# Patient Record
Sex: Female | Born: 1945 | Race: Black or African American | Hispanic: No | Smoking: Former smoker
Health system: Southern US, Community
[De-identification: ages and names within clinical notes are randomized; demographics above are authoritative.]

## PROBLEM LIST (undated history)

## (undated) DIAGNOSIS — I739 Peripheral vascular disease, unspecified: Secondary | ICD-10-CM

## (undated) DIAGNOSIS — Z9221 Personal history of antineoplastic chemotherapy: Secondary | ICD-10-CM

## (undated) DIAGNOSIS — K209 Esophagitis, unspecified without bleeding: Secondary | ICD-10-CM

## (undated) DIAGNOSIS — K219 Gastro-esophageal reflux disease without esophagitis: Secondary | ICD-10-CM

## (undated) DIAGNOSIS — E785 Hyperlipidemia, unspecified: Secondary | ICD-10-CM

## (undated) DIAGNOSIS — N189 Chronic kidney disease, unspecified: Secondary | ICD-10-CM

## (undated) DIAGNOSIS — M810 Age-related osteoporosis without current pathological fracture: Secondary | ICD-10-CM

## (undated) DIAGNOSIS — I340 Nonrheumatic mitral (valve) insufficiency: Secondary | ICD-10-CM

## (undated) DIAGNOSIS — I517 Cardiomegaly: Secondary | ICD-10-CM

## (undated) DIAGNOSIS — Z9889 Other specified postprocedural states: Secondary | ICD-10-CM

## (undated) DIAGNOSIS — K635 Polyp of colon: Secondary | ICD-10-CM

## (undated) DIAGNOSIS — C541 Malignant neoplasm of endometrium: Secondary | ICD-10-CM

## (undated) DIAGNOSIS — I071 Rheumatic tricuspid insufficiency: Secondary | ICD-10-CM

## (undated) DIAGNOSIS — Z9289 Personal history of other medical treatment: Secondary | ICD-10-CM

## (undated) DIAGNOSIS — J45909 Unspecified asthma, uncomplicated: Secondary | ICD-10-CM

## (undated) DIAGNOSIS — I2510 Atherosclerotic heart disease of native coronary artery without angina pectoris: Secondary | ICD-10-CM

## (undated) DIAGNOSIS — K2970 Gastritis, unspecified, without bleeding: Secondary | ICD-10-CM

## (undated) DIAGNOSIS — D649 Anemia, unspecified: Secondary | ICD-10-CM

## (undated) DIAGNOSIS — Z955 Presence of coronary angioplasty implant and graft: Secondary | ICD-10-CM

## (undated) DIAGNOSIS — I639 Cerebral infarction, unspecified: Secondary | ICD-10-CM

## (undated) DIAGNOSIS — Z923 Personal history of irradiation: Secondary | ICD-10-CM

## (undated) DIAGNOSIS — F329 Major depressive disorder, single episode, unspecified: Secondary | ICD-10-CM

## (undated) DIAGNOSIS — I82621 Acute embolism and thrombosis of deep veins of right upper extremity: Secondary | ICD-10-CM

## (undated) DIAGNOSIS — F32A Depression, unspecified: Secondary | ICD-10-CM

## (undated) DIAGNOSIS — I10 Essential (primary) hypertension: Secondary | ICD-10-CM

## (undated) DIAGNOSIS — I6529 Occlusion and stenosis of unspecified carotid artery: Secondary | ICD-10-CM

## (undated) DIAGNOSIS — E119 Type 2 diabetes mellitus without complications: Secondary | ICD-10-CM

## (undated) HISTORY — DX: Personal history of antineoplastic chemotherapy: Z92.21

## (undated) HISTORY — PX: ABDOMINAL HYSTERECTOMY: SHX81

## (undated) HISTORY — DX: Presence of coronary angioplasty implant and graft: Z95.5

## (undated) HISTORY — DX: Hyperlipidemia, unspecified: E78.5

## (undated) HISTORY — DX: Atherosclerotic heart disease of native coronary artery without angina pectoris: I25.10

## (undated) HISTORY — DX: Depression, unspecified: F32.A

## (undated) HISTORY — DX: Anemia, unspecified: D64.9

## (undated) HISTORY — DX: Occlusion and stenosis of unspecified carotid artery: I65.29

## (undated) HISTORY — DX: Age-related osteoporosis without current pathological fracture: M81.0

## (undated) HISTORY — DX: Other specified postprocedural states: Z98.890

## (undated) HISTORY — DX: Esophagitis, unspecified without bleeding: K20.90

## (undated) HISTORY — DX: Acute embolism and thrombosis of deep veins of right upper extremity: I82.621

## (undated) HISTORY — DX: Peripheral vascular disease, unspecified: I73.9

## (undated) HISTORY — DX: Gastritis, unspecified, without bleeding: K29.70

## (undated) HISTORY — DX: Cardiomegaly: I51.7

## (undated) HISTORY — PX: CORONARY ARTERY BYPASS GRAFT: SHX141

## (undated) HISTORY — DX: Rheumatic tricuspid insufficiency: I07.1

## (undated) HISTORY — DX: Esophagitis, unspecified: K20.9

## (undated) HISTORY — DX: Malignant neoplasm of endometrium: C54.1

## (undated) HISTORY — DX: Nonrheumatic mitral (valve) insufficiency: I34.0

## (undated) HISTORY — DX: Polyp of colon: K63.5

## (undated) HISTORY — DX: Personal history of irradiation: Z92.3

## (undated) HISTORY — DX: Personal history of other medical treatment: Z92.89

## (undated) HISTORY — DX: Major depressive disorder, single episode, unspecified: F32.9

## (undated) HISTORY — DX: Gastro-esophageal reflux disease without esophagitis: K21.9

---

## 2002-11-16 DIAGNOSIS — Z955 Presence of coronary angioplasty implant and graft: Secondary | ICD-10-CM

## 2002-11-16 HISTORY — PX: FEMORAL ARTERY - POPLITEAL ARTERY BYPASS GRAFT: SUR180

## 2002-11-16 HISTORY — DX: Presence of coronary angioplasty implant and graft: Z95.5

## 2002-11-16 HISTORY — PX: FEMORAL ARTERY - FEMORAL ARTERY BYPASS GRAFT: SUR179

## 2003-11-17 DIAGNOSIS — I82621 Acute embolism and thrombosis of deep veins of right upper extremity: Secondary | ICD-10-CM

## 2003-11-17 HISTORY — DX: Acute embolism and thrombosis of deep veins of right upper extremity: I82.621

## 2003-12-13 ENCOUNTER — Other Ambulatory Visit: Payer: Self-pay

## 2003-12-19 ENCOUNTER — Other Ambulatory Visit: Payer: Self-pay

## 2003-12-20 ENCOUNTER — Other Ambulatory Visit: Payer: Self-pay

## 2004-07-04 ENCOUNTER — Other Ambulatory Visit: Payer: Self-pay

## 2005-02-26 ENCOUNTER — Inpatient Hospital Stay: Payer: Self-pay | Admitting: Internal Medicine

## 2005-05-04 ENCOUNTER — Ambulatory Visit: Payer: Self-pay | Admitting: Unknown Physician Specialty

## 2005-05-06 ENCOUNTER — Emergency Department: Payer: Self-pay | Admitting: Emergency Medicine

## 2005-05-06 ENCOUNTER — Other Ambulatory Visit: Payer: Self-pay

## 2005-09-08 ENCOUNTER — Ambulatory Visit: Payer: Self-pay | Admitting: Unknown Physician Specialty

## 2006-03-29 ENCOUNTER — Ambulatory Visit: Payer: Self-pay | Admitting: Unknown Physician Specialty

## 2006-12-12 ENCOUNTER — Other Ambulatory Visit: Payer: Self-pay

## 2006-12-12 ENCOUNTER — Inpatient Hospital Stay: Payer: Self-pay | Admitting: Internal Medicine

## 2006-12-13 ENCOUNTER — Other Ambulatory Visit: Payer: Self-pay

## 2006-12-15 ENCOUNTER — Other Ambulatory Visit: Payer: Self-pay

## 2006-12-16 ENCOUNTER — Other Ambulatory Visit: Payer: Self-pay

## 2007-04-15 ENCOUNTER — Other Ambulatory Visit: Payer: Self-pay

## 2007-04-15 ENCOUNTER — Inpatient Hospital Stay: Payer: Self-pay | Admitting: Internal Medicine

## 2007-04-29 ENCOUNTER — Ambulatory Visit: Payer: Self-pay | Admitting: Unknown Physician Specialty

## 2007-05-11 ENCOUNTER — Ambulatory Visit: Payer: Self-pay | Admitting: Unknown Physician Specialty

## 2007-07-21 ENCOUNTER — Ambulatory Visit: Payer: Self-pay | Admitting: Obstetrics and Gynecology

## 2007-07-26 ENCOUNTER — Ambulatory Visit: Payer: Self-pay | Admitting: Gynecologic Oncology

## 2007-08-05 ENCOUNTER — Other Ambulatory Visit: Payer: Self-pay

## 2007-08-05 ENCOUNTER — Ambulatory Visit: Payer: Self-pay | Admitting: Obstetrics and Gynecology

## 2007-08-09 ENCOUNTER — Inpatient Hospital Stay: Payer: Self-pay | Admitting: Obstetrics and Gynecology

## 2007-08-17 ENCOUNTER — Ambulatory Visit: Payer: Self-pay | Admitting: Internal Medicine

## 2007-08-23 ENCOUNTER — Ambulatory Visit: Payer: Self-pay | Admitting: Internal Medicine

## 2007-09-17 ENCOUNTER — Ambulatory Visit: Payer: Self-pay | Admitting: Internal Medicine

## 2007-09-17 DIAGNOSIS — C541 Malignant neoplasm of endometrium: Secondary | ICD-10-CM

## 2007-09-17 HISTORY — DX: Malignant neoplasm of endometrium: C54.1

## 2007-09-17 HISTORY — PX: TOTAL ABDOMINAL HYSTERECTOMY W/ BILATERAL SALPINGOOPHORECTOMY: SHX83

## 2007-10-04 ENCOUNTER — Ambulatory Visit: Payer: Self-pay | Admitting: Gynecologic Oncology

## 2007-10-11 ENCOUNTER — Ambulatory Visit: Payer: Self-pay | Admitting: Vascular Surgery

## 2007-10-17 ENCOUNTER — Ambulatory Visit: Payer: Self-pay | Admitting: Internal Medicine

## 2007-10-17 ENCOUNTER — Ambulatory Visit: Payer: Self-pay | Admitting: Gynecologic Oncology

## 2007-11-17 ENCOUNTER — Ambulatory Visit: Payer: Self-pay | Admitting: Gynecologic Oncology

## 2007-11-17 ENCOUNTER — Ambulatory Visit: Payer: Self-pay | Admitting: Internal Medicine

## 2007-12-18 ENCOUNTER — Ambulatory Visit: Payer: Self-pay | Admitting: Gynecologic Oncology

## 2007-12-18 ENCOUNTER — Ambulatory Visit: Payer: Self-pay | Admitting: Internal Medicine

## 2008-01-15 ENCOUNTER — Ambulatory Visit: Payer: Self-pay | Admitting: Gynecologic Oncology

## 2008-01-15 ENCOUNTER — Ambulatory Visit: Payer: Self-pay | Admitting: Internal Medicine

## 2008-01-15 DIAGNOSIS — Z923 Personal history of irradiation: Secondary | ICD-10-CM

## 2008-01-15 HISTORY — DX: Personal history of irradiation: Z92.3

## 2008-02-15 ENCOUNTER — Ambulatory Visit: Payer: Self-pay | Admitting: Gynecologic Oncology

## 2008-02-15 ENCOUNTER — Ambulatory Visit: Payer: Self-pay | Admitting: Internal Medicine

## 2008-03-16 ENCOUNTER — Ambulatory Visit: Payer: Self-pay | Admitting: Internal Medicine

## 2008-03-16 ENCOUNTER — Ambulatory Visit: Payer: Self-pay | Admitting: Gynecologic Oncology

## 2008-04-16 ENCOUNTER — Ambulatory Visit: Payer: Self-pay | Admitting: Internal Medicine

## 2008-04-16 ENCOUNTER — Ambulatory Visit: Payer: Self-pay | Admitting: Gynecologic Oncology

## 2008-05-16 ENCOUNTER — Ambulatory Visit: Payer: Self-pay | Admitting: Gynecologic Oncology

## 2008-05-16 ENCOUNTER — Ambulatory Visit: Payer: Self-pay | Admitting: Internal Medicine

## 2008-06-16 ENCOUNTER — Ambulatory Visit: Payer: Self-pay | Admitting: Gynecologic Oncology

## 2008-07-05 ENCOUNTER — Ambulatory Visit: Payer: Self-pay | Admitting: Internal Medicine

## 2008-07-17 ENCOUNTER — Ambulatory Visit: Payer: Self-pay | Admitting: Radiation Oncology

## 2008-07-27 ENCOUNTER — Ambulatory Visit: Payer: Self-pay | Admitting: Internal Medicine

## 2008-08-16 ENCOUNTER — Ambulatory Visit: Payer: Self-pay | Admitting: Internal Medicine

## 2008-08-16 ENCOUNTER — Ambulatory Visit: Payer: Self-pay | Admitting: Radiation Oncology

## 2008-09-16 ENCOUNTER — Ambulatory Visit: Payer: Self-pay | Admitting: Radiation Oncology

## 2008-09-16 ENCOUNTER — Ambulatory Visit: Payer: Self-pay | Admitting: Internal Medicine

## 2008-09-26 ENCOUNTER — Ambulatory Visit: Payer: Self-pay | Admitting: Unknown Physician Specialty

## 2008-10-23 ENCOUNTER — Ambulatory Visit: Payer: Self-pay | Admitting: Vascular Surgery

## 2008-10-26 ENCOUNTER — Ambulatory Visit: Payer: Self-pay | Admitting: Internal Medicine

## 2008-11-16 ENCOUNTER — Ambulatory Visit: Payer: Self-pay | Admitting: Internal Medicine

## 2008-11-16 ENCOUNTER — Ambulatory Visit: Payer: Self-pay | Admitting: Gynecologic Oncology

## 2008-12-17 ENCOUNTER — Ambulatory Visit: Payer: Self-pay | Admitting: Internal Medicine

## 2009-01-15 ENCOUNTER — Ambulatory Visit: Payer: Self-pay | Admitting: Internal Medicine

## 2009-02-14 ENCOUNTER — Ambulatory Visit: Payer: Self-pay | Admitting: Internal Medicine

## 2009-03-12 ENCOUNTER — Ambulatory Visit: Payer: Self-pay | Admitting: Internal Medicine

## 2009-03-15 ENCOUNTER — Ambulatory Visit: Payer: Self-pay | Admitting: Vascular Surgery

## 2009-03-16 ENCOUNTER — Ambulatory Visit: Payer: Self-pay | Admitting: Internal Medicine

## 2009-04-16 ENCOUNTER — Ambulatory Visit: Payer: Self-pay | Admitting: Internal Medicine

## 2009-05-21 ENCOUNTER — Ambulatory Visit: Payer: Self-pay | Admitting: Internal Medicine

## 2009-05-21 ENCOUNTER — Ambulatory Visit: Payer: Self-pay | Admitting: Gynecologic Oncology

## 2009-06-16 ENCOUNTER — Ambulatory Visit: Payer: Self-pay | Admitting: Gynecologic Oncology

## 2009-06-16 ENCOUNTER — Ambulatory Visit: Payer: Self-pay | Admitting: Internal Medicine

## 2009-07-17 ENCOUNTER — Ambulatory Visit: Payer: Self-pay | Admitting: Internal Medicine

## 2009-07-24 ENCOUNTER — Ambulatory Visit: Payer: Self-pay | Admitting: Internal Medicine

## 2009-08-10 ENCOUNTER — Emergency Department: Payer: Self-pay | Admitting: Unknown Physician Specialty

## 2009-08-14 ENCOUNTER — Ambulatory Visit: Payer: Self-pay | Admitting: Internal Medicine

## 2009-08-16 ENCOUNTER — Ambulatory Visit: Payer: Self-pay | Admitting: Internal Medicine

## 2009-08-20 ENCOUNTER — Ambulatory Visit: Payer: Self-pay | Admitting: Internal Medicine

## 2009-09-16 ENCOUNTER — Ambulatory Visit: Payer: Self-pay | Admitting: Internal Medicine

## 2009-10-15 ENCOUNTER — Ambulatory Visit: Payer: Self-pay | Admitting: Internal Medicine

## 2009-10-16 ENCOUNTER — Ambulatory Visit: Payer: Self-pay | Admitting: Internal Medicine

## 2010-01-14 ENCOUNTER — Ambulatory Visit: Payer: Self-pay | Admitting: Internal Medicine

## 2010-01-29 ENCOUNTER — Ambulatory Visit: Payer: Self-pay | Admitting: Internal Medicine

## 2010-02-14 ENCOUNTER — Ambulatory Visit: Payer: Self-pay | Admitting: Internal Medicine

## 2010-02-19 ENCOUNTER — Ambulatory Visit: Payer: Self-pay | Admitting: Internal Medicine

## 2010-04-02 ENCOUNTER — Ambulatory Visit: Payer: Self-pay | Admitting: Internal Medicine

## 2010-04-07 ENCOUNTER — Ambulatory Visit: Payer: Self-pay | Admitting: Internal Medicine

## 2010-04-16 ENCOUNTER — Ambulatory Visit: Payer: Self-pay | Admitting: Internal Medicine

## 2010-04-29 ENCOUNTER — Ambulatory Visit: Payer: Self-pay | Admitting: Internal Medicine

## 2010-05-16 ENCOUNTER — Ambulatory Visit: Payer: Self-pay | Admitting: Internal Medicine

## 2010-08-16 ENCOUNTER — Ambulatory Visit: Payer: Self-pay | Admitting: Internal Medicine

## 2010-08-22 ENCOUNTER — Ambulatory Visit: Payer: Self-pay | Admitting: Internal Medicine

## 2010-08-24 LAB — CA 125: CA 125: 5.7 U/mL (ref 0.0–34.0)

## 2010-09-04 ENCOUNTER — Ambulatory Visit: Payer: Self-pay | Admitting: Internal Medicine

## 2010-09-16 ENCOUNTER — Ambulatory Visit: Payer: Self-pay | Admitting: Internal Medicine

## 2010-11-25 ENCOUNTER — Ambulatory Visit: Payer: Self-pay | Admitting: Internal Medicine

## 2010-11-27 LAB — CA 125: CA 125: 5.7 U/mL (ref 0.0–34.0)

## 2010-12-17 ENCOUNTER — Ambulatory Visit: Payer: Self-pay | Admitting: Internal Medicine

## 2011-03-05 ENCOUNTER — Ambulatory Visit: Payer: Self-pay | Admitting: Internal Medicine

## 2011-03-06 LAB — CA 125: CA 125: 7.3 U/mL (ref 0.0–34.0)

## 2011-03-17 ENCOUNTER — Ambulatory Visit: Payer: Self-pay | Admitting: Internal Medicine

## 2011-04-17 ENCOUNTER — Ambulatory Visit: Payer: Self-pay | Admitting: Internal Medicine

## 2011-05-26 ENCOUNTER — Ambulatory Visit: Payer: Self-pay | Admitting: Internal Medicine

## 2011-05-27 LAB — CA 125: CA 125: 8 U/mL (ref 0.0–34.0)

## 2011-06-17 ENCOUNTER — Ambulatory Visit: Payer: Self-pay | Admitting: Internal Medicine

## 2011-07-02 ENCOUNTER — Ambulatory Visit: Payer: Self-pay | Admitting: Internal Medicine

## 2011-07-17 ENCOUNTER — Ambulatory Visit: Payer: Self-pay | Admitting: Unknown Physician Specialty

## 2011-07-18 ENCOUNTER — Ambulatory Visit: Payer: Self-pay | Admitting: Internal Medicine

## 2011-08-25 ENCOUNTER — Ambulatory Visit: Payer: Self-pay | Admitting: Unknown Physician Specialty

## 2011-08-27 LAB — PATHOLOGY REPORT

## 2011-09-04 ENCOUNTER — Other Ambulatory Visit: Payer: Self-pay | Admitting: Unknown Physician Specialty

## 2011-09-04 ENCOUNTER — Other Ambulatory Visit: Payer: Self-pay | Admitting: Gastroenterology

## 2011-09-04 DIAGNOSIS — K56609 Unspecified intestinal obstruction, unspecified as to partial versus complete obstruction: Secondary | ICD-10-CM

## 2011-09-25 ENCOUNTER — Ambulatory Visit
Admission: RE | Admit: 2011-09-25 | Discharge: 2011-09-25 | Disposition: A | Discharge status: Home / Self Care | Payer: Medicare Other | Source: Ambulatory Visit | Attending: Gastroenterology | Admitting: Gastroenterology

## 2011-09-25 ENCOUNTER — Other Ambulatory Visit: Payer: Self-pay | Admitting: Unknown Physician Specialty

## 2011-09-25 DIAGNOSIS — K56609 Unspecified intestinal obstruction, unspecified as to partial versus complete obstruction: Secondary | ICD-10-CM

## 2011-10-05 ENCOUNTER — Ambulatory Visit: Payer: Self-pay | Admitting: Internal Medicine

## 2011-11-24 ENCOUNTER — Ambulatory Visit: Payer: Self-pay | Admitting: Internal Medicine

## 2011-11-25 LAB — CA 125: CA 125: 8.1 U/mL (ref 0.0–34.0)

## 2011-12-18 ENCOUNTER — Ambulatory Visit: Payer: Self-pay | Admitting: Internal Medicine

## 2012-03-04 ENCOUNTER — Ambulatory Visit: Payer: Self-pay | Admitting: Oncology

## 2012-03-04 LAB — CBC CANCER CENTER
Basophil #: 0.1 x10 3/mm (ref 0.0–0.1)
Basophil %: 1 %
Eosinophil #: 0.2 x10 3/mm (ref 0.0–0.7)
Eosinophil %: 4.1 %
HCT: 37.7 % (ref 35.0–47.0)
HGB: 12.3 g/dL (ref 12.0–16.0)
Lymphocyte #: 2.1 x10 3/mm (ref 1.0–3.6)
Lymphocyte %: 38.6 %
MCH: 27.6 pg (ref 26.0–34.0)
MCHC: 32.7 g/dL (ref 32.0–36.0)
MCV: 85 fL (ref 80–100)
Monocyte #: 0.6 x10 3/mm (ref 0.2–0.9)
Monocyte %: 11.7 %
Neutrophil #: 2.4 x10 3/mm (ref 1.4–6.5)
Neutrophil %: 44.6 %
Platelet: 218 x10 3/mm (ref 150–440)
RBC: 4.46 10*6/uL (ref 3.80–5.20)
RDW: 14.4 % (ref 11.5–14.5)
WBC: 5.4 x10 3/mm (ref 3.6–11.0)

## 2012-03-04 LAB — COMPREHENSIVE METABOLIC PANEL
Albumin: 3.8 g/dL (ref 3.4–5.0)
Alkaline Phosphatase: 111 U/L (ref 50–136)
Anion Gap: 9 (ref 7–16)
BUN: 21 mg/dL — ABNORMAL HIGH (ref 7–18)
Bilirubin,Total: 0.3 mg/dL (ref 0.2–1.0)
Calcium, Total: 9.4 mg/dL (ref 8.5–10.1)
Chloride: 99 mmol/L (ref 98–107)
Co2: 30 mmol/L (ref 21–32)
Creatinine: 1.11 mg/dL (ref 0.60–1.30)
EGFR (African American): >60
EGFR (Non-African Amer.): 52 — ABNORMAL LOW
Glucose: 108 mg/dL — ABNORMAL HIGH (ref 65–99)
Osmolality: 279 (ref 275–301)
Potassium: 3.8 mmol/L (ref 3.5–5.1)
SGOT(AST): 21 U/L (ref 15–37)
SGPT (ALT): 26 U/L
Sodium: 138 mmol/L (ref 136–145)
Total Protein: 8.1 g/dL (ref 6.4–8.2)

## 2012-03-06 LAB — CA 125: CA 125: 7.6 U/mL (ref 0.0–34.0)

## 2012-03-16 ENCOUNTER — Ambulatory Visit: Payer: Self-pay | Admitting: Oncology

## 2012-05-24 ENCOUNTER — Ambulatory Visit: Payer: Self-pay | Admitting: Oncology

## 2012-05-25 LAB — CA 125: CA 125: 9 U/mL (ref 0.0–34.0)

## 2012-05-30 LAB — PATHOLOGY REPORT

## 2012-06-13 ENCOUNTER — Other Ambulatory Visit: Payer: Self-pay

## 2012-06-13 LAB — SEDIMENTATION RATE: Erythrocyte Sed Rate: 16 mm/hr (ref 0–30)

## 2012-06-16 ENCOUNTER — Ambulatory Visit: Payer: Self-pay | Admitting: Oncology

## 2012-06-21 ENCOUNTER — Ambulatory Visit: Payer: Self-pay | Admitting: Vascular Surgery

## 2012-07-06 ENCOUNTER — Ambulatory Visit: Payer: Self-pay | Admitting: Vascular Surgery

## 2012-07-06 LAB — CBC
HCT: 37.7 % (ref 35.0–47.0)
HGB: 12.8 g/dL (ref 12.0–16.0)
MCH: 28.3 pg (ref 26.0–34.0)
MCHC: 33.8 g/dL (ref 32.0–36.0)
MCV: 84 fL (ref 80–100)
Platelet: 247 10*3/uL (ref 150–440)
RBC: 4.52 10*6/uL (ref 3.80–5.20)
RDW: 14.5 % (ref 11.5–14.5)
WBC: 6.2 10*3/uL (ref 3.6–11.0)

## 2012-07-06 LAB — BASIC METABOLIC PANEL
Anion Gap: 7 (ref 7–16)
BUN: 21 mg/dL — ABNORMAL HIGH (ref 7–18)
Calcium, Total: 9.6 mg/dL (ref 8.5–10.1)
Chloride: 100 mmol/L (ref 98–107)
Co2: 30 mmol/L (ref 21–32)
Creatinine: 1.15 mg/dL (ref 0.60–1.30)
EGFR (African American): 57 — ABNORMAL LOW
EGFR (Non-African Amer.): 50 — ABNORMAL LOW
Glucose: 91 mg/dL (ref 65–99)
Osmolality: 276 (ref 275–301)
Potassium: 3.4 mmol/L — ABNORMAL LOW (ref 3.5–5.1)
Sodium: 137 mmol/L (ref 136–145)

## 2012-07-20 ENCOUNTER — Inpatient Hospital Stay: Payer: Self-pay | Admitting: Vascular Surgery

## 2012-07-21 LAB — CBC WITH DIFFERENTIAL/PLATELET
Basophil #: 0.1 10*3/uL (ref 0.0–0.1)
Basophil %: 1.2 %
Eosinophil #: 0.1 10*3/uL (ref 0.0–0.7)
Eosinophil %: 1.7 %
HCT: 34.4 % — ABNORMAL LOW (ref 35.0–47.0)
HGB: 11.3 g/dL — ABNORMAL LOW (ref 12.0–16.0)
Lymphocyte #: 1.9 10*3/uL (ref 1.0–3.6)
Lymphocyte %: 26 %
MCH: 27.5 pg (ref 26.0–34.0)
MCHC: 32.8 g/dL (ref 32.0–36.0)
MCV: 84 fL (ref 80–100)
Monocyte #: 0.7 x10 3/mm (ref 0.2–0.9)
Monocyte %: 9 %
Neutrophil #: 4.5 10*3/uL (ref 1.4–6.5)
Neutrophil %: 62.1 %
Platelet: 205 10*3/uL (ref 150–440)
RBC: 4.1 10*6/uL (ref 3.80–5.20)
RDW: 14.8 % — ABNORMAL HIGH (ref 11.5–14.5)
WBC: 7.3 10*3/uL (ref 3.6–11.0)

## 2012-07-21 LAB — BASIC METABOLIC PANEL
Anion Gap: 4 — ABNORMAL LOW (ref 7–16)
BUN: 14 mg/dL (ref 7–18)
Calcium, Total: 8.4 mg/dL — ABNORMAL LOW (ref 8.5–10.1)
Chloride: 108 mmol/L — ABNORMAL HIGH (ref 98–107)
Co2: 26 mmol/L (ref 21–32)
Creatinine: 1.03 mg/dL (ref 0.60–1.30)
EGFR (African American): >60
EGFR (Non-African Amer.): 57 — ABNORMAL LOW
Glucose: 105 mg/dL — ABNORMAL HIGH (ref 65–99)
Osmolality: 277 (ref 275–301)
Potassium: 3.8 mmol/L (ref 3.5–5.1)
Sodium: 138 mmol/L (ref 136–145)

## 2012-07-21 LAB — PROTIME-INR
INR: 1
Prothrombin Time: 13.6 secs (ref 11.5–14.7)

## 2012-07-21 LAB — APTT: Activated PTT: 33.5 secs (ref 23.6–35.9)

## 2012-07-25 LAB — PATHOLOGY REPORT

## 2012-10-03 ENCOUNTER — Ambulatory Visit: Payer: Self-pay | Admitting: Oncology

## 2012-10-03 LAB — COMPREHENSIVE METABOLIC PANEL
Albumin: 3.7 g/dL (ref 3.4–5.0)
Alkaline Phosphatase: 133 U/L (ref 50–136)
Anion Gap: 13 (ref 7–16)
BUN: 35 mg/dL — ABNORMAL HIGH (ref 7–18)
Bilirubin,Total: 0.2 mg/dL (ref 0.2–1.0)
Calcium, Total: 9.3 mg/dL (ref 8.5–10.1)
Chloride: 103 mmol/L (ref 98–107)
Co2: 24 mmol/L (ref 21–32)
Creatinine: 1.58 mg/dL — ABNORMAL HIGH (ref 0.60–1.30)
EGFR (African American): 39 — ABNORMAL LOW
EGFR (Non-African Amer.): 34 — ABNORMAL LOW
Glucose: 115 mg/dL — ABNORMAL HIGH (ref 65–99)
Osmolality: 288 (ref 275–301)
Potassium: 3.4 mmol/L — ABNORMAL LOW (ref 3.5–5.1)
SGOT(AST): 16 U/L (ref 15–37)
SGPT (ALT): 25 U/L (ref 12–78)
Sodium: 140 mmol/L (ref 136–145)
Total Protein: 8.2 g/dL (ref 6.4–8.2)

## 2012-10-03 LAB — CBC CANCER CENTER
Basophil #: 0.1 x10 3/mm (ref 0.0–0.1)
Basophil %: 1 %
Eosinophil #: 0.2 x10 3/mm (ref 0.0–0.7)
Eosinophil %: 3.3 %
HCT: 37.6 % (ref 35.0–47.0)
HGB: 12 g/dL (ref 12.0–16.0)
Lymphocyte #: 2.5 x10 3/mm (ref 1.0–3.6)
Lymphocyte %: 38.9 %
MCH: 26.8 pg (ref 26.0–34.0)
MCHC: 32 g/dL (ref 32.0–36.0)
MCV: 84 fL (ref 80–100)
Monocyte #: 0.7 x10 3/mm (ref 0.2–0.9)
Monocyte %: 11.7 %
Neutrophil #: 2.9 x10 3/mm (ref 1.4–6.5)
Neutrophil %: 45.1 %
Platelet: 279 x10 3/mm (ref 150–440)
RBC: 4.48 10*6/uL (ref 3.80–5.20)
RDW: 14.9 % — ABNORMAL HIGH (ref 11.5–14.5)
WBC: 6.4 x10 3/mm (ref 3.6–11.0)

## 2012-10-04 LAB — CA 125: CA 125: 7.5 U/mL (ref 0.0–34.0)

## 2012-10-16 ENCOUNTER — Ambulatory Visit: Payer: Self-pay | Admitting: Oncology

## 2012-11-16 ENCOUNTER — Ambulatory Visit: Payer: Self-pay | Admitting: Oncology

## 2012-11-16 DIAGNOSIS — Z9889 Other specified postprocedural states: Secondary | ICD-10-CM

## 2012-11-16 HISTORY — PX: CAROTID ENDARTERECTOMY: SUR193

## 2012-11-16 HISTORY — DX: Other specified postprocedural states: Z98.890

## 2012-12-17 ENCOUNTER — Ambulatory Visit: Payer: Self-pay | Admitting: Oncology

## 2013-01-10 ENCOUNTER — Ambulatory Visit: Payer: Self-pay | Admitting: Internal Medicine

## 2013-02-02 ENCOUNTER — Ambulatory Visit: Payer: Self-pay | Admitting: Vascular Surgery

## 2013-02-02 LAB — CBC
HCT: 39.2 % (ref 35.0–47.0)
HGB: 12.7 g/dL (ref 12.0–16.0)
MCH: 27.1 pg (ref 26.0–34.0)
MCHC: 32.3 g/dL (ref 32.0–36.0)
MCV: 84 fL (ref 80–100)
Platelet: 229 10*3/uL (ref 150–440)
RBC: 4.68 10*6/uL (ref 3.80–5.20)
RDW: 14.6 % — ABNORMAL HIGH (ref 11.5–14.5)
WBC: 5.1 10*3/uL (ref 3.6–11.0)

## 2013-02-02 LAB — BASIC METABOLIC PANEL
Anion Gap: 10 (ref 7–16)
BUN: 30 mg/dL — ABNORMAL HIGH (ref 7–18)
Calcium, Total: 8.9 mg/dL (ref 8.5–10.1)
Chloride: 103 mmol/L (ref 98–107)
Co2: 23 mmol/L (ref 21–32)
Creatinine: 1.22 mg/dL (ref 0.60–1.30)
EGFR (African American): 53 — ABNORMAL LOW
EGFR (Non-African Amer.): 46 — ABNORMAL LOW
Glucose: 98 mg/dL (ref 65–99)
Osmolality: 278 (ref 275–301)
Potassium: 4.1 mmol/L (ref 3.5–5.1)
Sodium: 136 mmol/L (ref 136–145)

## 2013-02-24 ENCOUNTER — Inpatient Hospital Stay: Payer: Self-pay | Admitting: Vascular Surgery

## 2013-02-24 LAB — POTASSIUM: Potassium: 3.6 mmol/L (ref 3.5–5.1)

## 2013-02-25 LAB — APTT: Activated PTT: 30.2 secs (ref 23.6–35.9)

## 2013-02-25 LAB — CBC WITH DIFFERENTIAL/PLATELET
Basophil #: 0 10*3/uL (ref 0.0–0.1)
Basophil %: 0.1 %
Eosinophil #: 0 10*3/uL (ref 0.0–0.7)
Eosinophil %: 0 %
HCT: 33.2 % — ABNORMAL LOW (ref 35.0–47.0)
HGB: 10.6 g/dL — ABNORMAL LOW (ref 12.0–16.0)
Lymphocyte #: 1 10*3/uL (ref 1.0–3.6)
Lymphocyte %: 7.8 %
MCH: 26.4 pg (ref 26.0–34.0)
MCHC: 31.8 g/dL — ABNORMAL LOW (ref 32.0–36.0)
MCV: 83 fL (ref 80–100)
Monocyte #: 1 x10 3/mm — ABNORMAL HIGH (ref 0.2–0.9)
Monocyte %: 7.7 %
Neutrophil #: 10.4 10*3/uL — ABNORMAL HIGH (ref 1.4–6.5)
Neutrophil %: 84.4 %
Platelet: 185 10*3/uL (ref 150–440)
RBC: 4 10*6/uL (ref 3.80–5.20)
RDW: 15.1 % — ABNORMAL HIGH (ref 11.5–14.5)
WBC: 12.4 10*3/uL — ABNORMAL HIGH (ref 3.6–11.0)

## 2013-02-25 LAB — BASIC METABOLIC PANEL
Anion Gap: 4 — ABNORMAL LOW (ref 7–16)
BUN: 17 mg/dL (ref 7–18)
Calcium, Total: 8.3 mg/dL — ABNORMAL LOW (ref 8.5–10.1)
Chloride: 111 mmol/L — ABNORMAL HIGH (ref 98–107)
Co2: 25 mmol/L (ref 21–32)
Creatinine: 0.94 mg/dL (ref 0.60–1.30)
EGFR (African American): >60
EGFR (Non-African Amer.): >60
Glucose: 137 mg/dL — ABNORMAL HIGH (ref 65–99)
Osmolality: 283 (ref 275–301)
Potassium: 4.3 mmol/L (ref 3.5–5.1)
Sodium: 140 mmol/L (ref 136–145)

## 2013-02-25 LAB — PROTIME-INR
INR: 1.1
Prothrombin Time: 13.9 secs (ref 11.5–14.7)

## 2013-02-27 LAB — PATHOLOGY REPORT

## 2013-03-16 ENCOUNTER — Ambulatory Visit: Payer: Self-pay | Admitting: Oncology

## 2013-03-28 ENCOUNTER — Ambulatory Visit: Payer: Self-pay | Admitting: Oncology

## 2013-03-29 LAB — CA 125: CA 125: 5.3 U/mL (ref 0.0–34.0)

## 2013-04-04 ENCOUNTER — Ambulatory Visit: Payer: Self-pay | Admitting: Vascular Surgery

## 2013-04-16 ENCOUNTER — Ambulatory Visit: Payer: Self-pay | Admitting: Oncology

## 2013-06-08 ENCOUNTER — Ambulatory Visit: Payer: Self-pay | Admitting: Oncology

## 2013-06-11 ENCOUNTER — Emergency Department: Payer: Self-pay | Admitting: Emergency Medicine

## 2013-06-11 ENCOUNTER — Ambulatory Visit: Payer: Self-pay | Admitting: Vascular Surgery

## 2013-06-11 LAB — COMPREHENSIVE METABOLIC PANEL
Albumin: 3.7 g/dL (ref 3.4–5.0)
Alkaline Phosphatase: 118 U/L (ref 50–136)
Anion Gap: 6 — ABNORMAL LOW (ref 7–16)
BUN: 27 mg/dL — ABNORMAL HIGH (ref 7–18)
Bilirubin,Total: 0.3 mg/dL (ref 0.2–1.0)
Calcium, Total: 9.6 mg/dL (ref 8.5–10.1)
Chloride: 103 mmol/L (ref 98–107)
Co2: 27 mmol/L (ref 21–32)
Creatinine: 1.2 mg/dL (ref 0.60–1.30)
EGFR (African American): 54 — ABNORMAL LOW
EGFR (Non-African Amer.): 47 — ABNORMAL LOW
Glucose: 107 mg/dL — ABNORMAL HIGH (ref 65–99)
Osmolality: 278 (ref 275–301)
Potassium: 3.7 mmol/L (ref 3.5–5.1)
SGOT(AST): 21 U/L (ref 15–37)
SGPT (ALT): 23 U/L (ref 12–78)
Sodium: 136 mmol/L (ref 136–145)
Total Protein: 8.3 g/dL — ABNORMAL HIGH (ref 6.4–8.2)

## 2013-06-11 LAB — CBC
HCT: 38.6 % (ref 35.0–47.0)
HGB: 12.6 g/dL (ref 12.0–16.0)
MCH: 26.7 pg (ref 26.0–34.0)
MCHC: 32.7 g/dL (ref 32.0–36.0)
MCV: 82 fL (ref 80–100)
Platelet: 258 10*3/uL (ref 150–440)
RBC: 4.71 10*6/uL (ref 3.80–5.20)
RDW: 15 % — ABNORMAL HIGH (ref 11.5–14.5)
WBC: 6.7 10*3/uL (ref 3.6–11.0)

## 2013-06-11 LAB — TROPONIN I: Troponin-I: <0.02 ng/mL

## 2013-06-30 ENCOUNTER — Ambulatory Visit: Payer: Self-pay | Admitting: Oncology

## 2013-11-16 DIAGNOSIS — Z9289 Personal history of other medical treatment: Secondary | ICD-10-CM

## 2013-11-16 HISTORY — DX: Personal history of other medical treatment: Z92.89

## 2013-11-16 HISTORY — PX: ROTATOR CUFF REPAIR: SHX139

## 2014-04-02 ENCOUNTER — Ambulatory Visit: Payer: Self-pay | Admitting: Oncology

## 2014-04-16 ENCOUNTER — Ambulatory Visit: Payer: Self-pay | Admitting: Oncology

## 2014-04-21 ENCOUNTER — Emergency Department: Payer: Self-pay | Admitting: Emergency Medicine

## 2014-04-27 DIAGNOSIS — M5412 Radiculopathy, cervical region: Secondary | ICD-10-CM | POA: Insufficient documentation

## 2014-04-27 DIAGNOSIS — M5030 Other cervical disc degeneration, unspecified cervical region: Secondary | ICD-10-CM | POA: Insufficient documentation

## 2014-04-27 DIAGNOSIS — M19019 Primary osteoarthritis, unspecified shoulder: Secondary | ICD-10-CM | POA: Insufficient documentation

## 2014-05-08 ENCOUNTER — Ambulatory Visit: Payer: Self-pay | Admitting: Physical Medicine and Rehabilitation

## 2014-06-06 DIAGNOSIS — M75101 Unspecified rotator cuff tear or rupture of right shoulder, not specified as traumatic: Secondary | ICD-10-CM | POA: Insufficient documentation

## 2014-06-18 ENCOUNTER — Ambulatory Visit: Payer: Self-pay | Admitting: General Practice

## 2014-06-18 LAB — CBC WITH DIFFERENTIAL/PLATELET
Basophil #: 0.1 10*3/uL (ref 0.0–0.1)
Basophil %: 1.1 %
Eosinophil #: 0.3 10*3/uL (ref 0.0–0.7)
Eosinophil %: 5 %
HCT: 41.1 % (ref 35.0–47.0)
HGB: 13.2 g/dL (ref 12.0–16.0)
Lymphocyte #: 1.8 10*3/uL (ref 1.0–3.6)
Lymphocyte %: 33.8 %
MCH: 27.4 pg (ref 26.0–34.0)
MCHC: 32.1 g/dL (ref 32.0–36.0)
MCV: 85 fL (ref 80–100)
Monocyte #: 0.5 x10 3/mm (ref 0.2–0.9)
Monocyte %: 9.1 %
Neutrophil #: 2.7 10*3/uL (ref 1.4–6.5)
Neutrophil %: 51 %
Platelet: 256 10*3/uL (ref 150–440)
RBC: 4.82 10*6/uL (ref 3.80–5.20)
RDW: 14.1 % (ref 11.5–14.5)
WBC: 5.2 10*3/uL (ref 3.6–11.0)

## 2014-06-18 LAB — POTASSIUM: Potassium: 4 mmol/L (ref 3.5–5.1)

## 2014-06-27 ENCOUNTER — Ambulatory Visit: Payer: Self-pay | Admitting: General Practice

## 2014-07-20 DIAGNOSIS — Z9889 Other specified postprocedural states: Secondary | ICD-10-CM | POA: Insufficient documentation

## 2014-08-25 DIAGNOSIS — IMO0002 Reserved for concepts with insufficient information to code with codable children: Secondary | ICD-10-CM | POA: Insufficient documentation

## 2014-08-25 DIAGNOSIS — M109 Gout, unspecified: Secondary | ICD-10-CM | POA: Insufficient documentation

## 2014-08-25 DIAGNOSIS — J449 Chronic obstructive pulmonary disease, unspecified: Secondary | ICD-10-CM | POA: Insufficient documentation

## 2014-08-25 DIAGNOSIS — Z8542 Personal history of malignant neoplasm of other parts of uterus: Secondary | ICD-10-CM | POA: Insufficient documentation

## 2014-08-25 DIAGNOSIS — E7800 Pure hypercholesterolemia, unspecified: Secondary | ICD-10-CM | POA: Insufficient documentation

## 2014-08-25 DIAGNOSIS — M1000 Idiopathic gout, unspecified site: Secondary | ICD-10-CM | POA: Insufficient documentation

## 2014-08-25 DIAGNOSIS — E1122 Type 2 diabetes mellitus with diabetic chronic kidney disease: Secondary | ICD-10-CM | POA: Insufficient documentation

## 2014-08-25 DIAGNOSIS — Z6835 Body mass index (BMI) 35.0-35.9, adult: Secondary | ICD-10-CM | POA: Insufficient documentation

## 2014-08-25 DIAGNOSIS — I2510 Atherosclerotic heart disease of native coronary artery without angina pectoris: Secondary | ICD-10-CM | POA: Insufficient documentation

## 2014-10-22 ENCOUNTER — Emergency Department: Payer: Self-pay | Admitting: Emergency Medicine

## 2015-03-05 NOTE — Op Note (Signed)
PATIENT NAME:  Alyssa, Murillo MR#:  [id number] DATE OF BIRTH:  [birth year]  DATE OF PROCEDURE:  07/20/2012  PREOPERATIVE DIAGNOSIS: Symptomatic critical stenosis of the right internal carotid artery.   POSTOPERATIVE DIAGNOSIS: Symptomatic critical stenosis of the right internal carotid artery.  PROCEDURES:  1. Right carotid endarterectomy with CorMatrix patch angioplasty.  2. Repair of arterial defect with Xenograft CorMatrix patch.   SURGEON: Katha Cabal, MD  ASSISTANT: Real Cons, PA   ANESTHESIA: General by endotracheal intubation.   FLUIDS: Per anesthesia record.   ESTIMATED BLOOD LOSS: 75 mL.   SPECIMEN: Carotid plaque to pathology for permanent section.   INDICATIONS: Alyssa Murillo is a 69 year old woman who presented to the office with symptoms consistent with a right hemispheric transient ischemic attack. Her neurologic changes completely resolved after a very short time; however, work-up demonstrated critical stenosis of the right internal carotid artery as well as critical stenosis of the left common carotid artery. Since the symptoms were right hemispheric, the right carotid is being addressed first. The risks and benefits as well as alternative therapies were reviewed with the patient. All questions are answered. The patient agrees to proceed.   DESCRIPTION OF PROCEDURE: The patient is taken to the Operating Room and placed in the supine position. After adequate general anesthesia has been induced and appropriate invasive monitors are placed, she is positioned with her neck extended rotated to the left. The neck and chest wall are then prepped and draped in sterile fashion.  A curvilinear incision is created along the anterior margin of the sternocleidomastoid muscle and carried down through the soft tissues ligating the external jugular vein between 3-0 silk ties transecting platysma. The sternocleidomastoid muscle is then reflected posterolaterally and the common  carotid artery identified at the level of the omohyoid. Dissection is then carried in a craniad direction along the anterior margin of the carotid artery. The bifurcation is identified. It is noted to be quite high, significantly higher than it appeared on the CT scan. Both the vagus nerve as well as the hypoglossal nerve are readily identified and left undisturbed. Dissection is then carried circumferentially around the superior thyroidal branch as well as the external carotid artery. The internal carotid artery is dissected to a level above visible plaque formation.   Heparin, 7000 units, is given and allowed to circulate for five minutes. The common carotid, followed by the external, followed by the internal carotid artery is clamped. Arteriotomy is made and an indwelling Sundt shunt is placed without difficulty. Flow was re-established to the brain. Endarterectomy is then performed under direct visualization for the common carotid bulb and internal carotid artery. The external carotid artery is treated with the eversion technique. Five interrupted 7-0 Prolene sutures are used to tack the distal intimal edge within the internal carotid artery. A CorMatrix patch is rehydrated on the back table. It is then trimmed to an appropriate taper and applied to the arterial defect for repair of this artery using running 6-0 Prolene in a four-quadrant technique. The shunt is removed. The patch is irrigated copiously, and the suture line is completed. Flow is then re-established first to the external carotid artery and then the internal carotid artery to prevent distal embolization. After Surgicel is placed along the suture line and light pressure is held for several minutes, there is complete hemostasis. Systolic blood pressure is allowed to increase to 130 to 140 mmHg. No evidence of bleeding is noted, and the wound is then closed using running 3-0 Vicryl  for the platysma and 4-0 Monocryl subcuticular for the skin.  Dermabond is applied.   The patient is awakened in the Operating Room, moving all extremities, and obeying simple  commands. She is taken to the recovery area in stable condition.  ____________________________ Katha Cabal, MD ggs:cbb D: 07/20/2012 09:51:48 ET T: 07/20/2012 11:39:10 ET JOB#: [id number]  cc: Katha Cabal, MD, <Dictator> Ocie Cornfield. Ouida Sills, [place] MD ELECTRONICALLY SIGNED 07/22/2012 16:24

## 2015-03-08 NOTE — Consult Note (Signed)
PATIENT NAME:  Alyssa Murillo, Alyssa Murillo MR#:  [id number] DATE OF BIRTH:  [birth year]  DATE OF CONSULTATION:    REFERRING PHYSICIAN:  Dr. Delana Meyer CONSULTING PHYSICIAN:  Theodoro Grist, MD  REASON FOR CONSULTATION:  Consult was requested by Dr. Delana Meyer in regards to medical management.   PRIMARY CARE PHYSICIAN:  Dr. Ouida Sills.  HISTORY OF PRESENT ILLNESS:  The patient is a 69 year old African American female with past medical history significant for history of stroke with right eye vision disturbance, history of coronary artery disease, status post right CVA in September 2013, status post left CVA today on 02/24/2013, presented for procedure by Dr. Delana Meyer for management of her high-grade carotid artery stenosis.  Apparently patient had symptomatic critical stenosis of right carotid artery with stroke which affected the patient's right eye vision.  Work-up of this stroke revealed bilateral carotid artery disease.  First carotid endarterectomy was done on the right carotid artery in September 2013 and now since the patient had 95% occlusion in the left carotid artery she also underwent left carotid endarterectomy today on 02/24/2013.  Dr. Hortencia Pilar requested medical management of her multiple medical problems.   PAST MEDICAL HISTORY:  Significant for history of hypertension, history of right eye vision disturbance due to stroke, a history of right CVA in September 2013, otherwise no residuals from stroke, history of coronary artery disease status post stent placement, no heart attack in the past, history of asthma, gastroesophageal reflux disease, anemia, depression, endometrial cancer, chronic obstructive pulmonary disease, anemia of chronic disease, gastroesophageal reflux disease, history of mild left ventricular hypertrophy as well as hypertension.  Echocardiogram in the past showed also mitral valve as well as tricuspid valve regurgitation, normal left ventricular systolic function, normal myocardial  perfusion, peripheral vascular disease, history of colon polyps, esophagitis, gastritis, recurrent bronchitis and upper airway disease, depression.   PAST SURGICAL HISTORY:  Exploratory laparotomy for left salpingo-oophorectomy for ovarian cyst, femoral-popliteal bypass as well as femoral graft in the right femoral artery, stent placement in circumflex coronary artery and cervical laminectomy, removal of right brachial thrombus in 2005 and as mentioned above patient had a right as well as left CVAs over the past 6 months, status post 5 pregnancies and 4 deliveries.   FAMILY HISTORY:  The patient's niece had breast cancer.  Brother had colon as well as prostate cancer and sister had lung cancer.   SOCIAL HISTORY:  She is divorced.  Quit smoking in 2007.  Drinks alcohol occasionally.  Does not use any drugs.   ALLERGIES:  CODEINE, PLAVIX, LISINOPRIL AS WELL AS BISPHOSPHONATE DRUGS.  MEDICATIONS:  According to medical records, patient is on the following medications at home.  Albuterol CFC-free one 4 times daily as needed, aspirin 325 mg by mouth daily, Benicar 40 mg by mouth daily, calcium carbonate with magnesium carbonate and zinc 1 tablet once a day, Crestor 10 mg by mouth at bedtime, iron sulfate 325 mg by mouth daily, furosemide 40 mg by mouth daily, isosorbide mononitrate 30 mg by mouth daily, Nexium 40 mg by mouth twice daily, NitroQuick 1 tablet every 5 minutes as needed x 3, potassium chloride 8 mEq once daily, propranolol 80 mg by mouth daily, Spiriva 18 mcg inhalation capsule daily, spironolactone 25 mg 1/2 tablet once a day, vitamin A 8000 units once daily, zolpidem 10 mg by mouth at bedtime.    Here in the hospital in the intensive care unit patient is on dopamine on 4 mcg per kilogram, Ancef 1 gram prior to coming to  OR, Norco 5/325 1 to 2 tablets every 4 to 6 hours as needed, aluminum, magnesium hydroxide with simethicone 200/200/20 in 5 mL suspension, 15 to 30 mL every 4 to 6 hours as  needed.  Ancef 1 gram every 8 hours x 3 doses postoperatively, labetalol injection 5 to 10 mg IV every 30 minutes as needed for systolic blood pressure more than 440 and the diastolic more than 90, hold for heart rate below 60.  Morphine 2 mg IV every 1 hour as needed.  Zofran 4 mg IV every 2 to 4 hours as needed and aspirin 81 mg by mouth daily.   REVIEW OF SYSTEMS:  Positive for hysterectomy for cancer in the past, pains in her left neck  and weight gain after hysterectomy some years ago, approximately 50 pounds since approximately 6 years ago.  Cataracts, being followed by ophthalmologist.  Right eye vision problems, year-round allergies with sinus congestion, some sinus drainage and yellowish phlegm production sometimes with cough, which is likely due to sinus congestion, intermittent wheezing in her lungs, but no significant problems recently.  Some intermittent constipation.  Otherwise, denies fevers, chills, fatigue, weakness, weight loss. EYES:  Denies blurry vision, double vision or glaucoma.  EARS, NOSE, THROAT:  Denies any tinnitus, allergies, epistaxis, sinus pain, dentures, difficulty swallowing.   RESPIRATORY:  No hemoptysis, asthma, COPD.  CARDIOVASCULAR:  Denies any chest pains, orthopnea, arrhythmias, palpitations or syncope.  GASTROINTESTINAL:  Denies any nausea, vomiting, rectal bleeding, change in bowel habits.  GENITOURINARY:  Denies dysuria, hematuria, frequency, incontinence.  ENDOCRINOLOGY:  Denies any polydipsia, nocturia, thyroid problems, heat or cold intolerance, or thirst.  HEMATOLOGY AND LYMPHATIC:  Denies any anemia, easy bruising, bleeding, swollen glands.  SKIN:  Denies acne, rashes or change in moles.  MUSCULOSKELETAL:  Denies arthritis, cramps, swelling.   NEUROLOGIC:  Denies any numbness, epilepsy or tremor.  PSYCHIATRIC:  Denies anxiety, insomnia or depression.   PHYSICAL EXAMINATION:   VITAL SIGNS:  Temperature is 98.2, pulse 88, respiratory rate was 25, blood  pressure 104/62, saturation 99% on 3 liters of oxygen by nasal cannula.  GENERAL:  This is a well-developed, well-nourished, mildly obese African American female in no significant distress, in good spirits, lying on the stretcher.  HEENT:  Her pupils are equal, reactive to light.  Extraocular movements are intact.  No icterus or conjunctivitis.  Has normal hearing.  No oropharyngeal erythema.  Mucosa is moist.  NECK:  Revealed a scar on the left side of her neck.  There is no significant swelling.  Minimal bruising around the scar was noted.  No significant tenderness was noted.  LYMPHATIC:  No adenopathy in the cervical region.  RESPIRATORY:  Clear to auscultation in all fields.  A few rhonchi were heard bilateral anteriorly.  Not able to hear posteriorly as patient is supine.  No wheezing.  No labored respirations or increased effort, dullness to percussion or overt respiratory distress.  CARDIOVASCULAR:  S1, S2 appreciated.  Mild 2 out of 6 systolic murmur was heard at mitral auscultation side.  PMI not lateralized.  Chest is nontender to palpation.  EXTREMITIES:  1+ pedal pulses.  No lower extremity edema, calf tenderness or cyanosis noted.  ABDOMEN:  Soft, nontender.  Bowel sounds are present.  No hepatosplenomegaly or masses were noted.  RECTAL:  Deferred.  MUSCLE STRENGTH:  Able to move all extremities.  No cyanosis, degenerative joint disease.  Not able to assess for kyphosis.  Gait is not tested.  SKIN:  Did not  reveal any other rashes, lesions, erythema, nodularity, induration.  The patient did have a scar after operation of left carotid endarterectomy with some minimal bruising  around it and minimal if any swelling.  No adenopathy in the cervical region. NEUROLOGICAL:  Cranial nerves grossly intact.  Sensory is intact.  No dysarthria, aphasia.  The patient is alert, oriented to time, person, place, cooperative.  Memory is good.  No significant confusion, agitation or depression noted.    LABORATORY DATA:  EKG not done.  Potassium level of 3.4 on the day of admission 02/24/2013, glucose 140 post procedure at around 4:00 p.m.  Other studies were done in March 2014.  At that time, patient's BMP was unremarkable except a mild elevation of BUN to 30, otherwise unremarkable study.  Liver enzymes were done only in November 2013, were within normal limits.  March 2014, CBC was within normal limits.    ASSESSMENT AND PLAN:   1.  Hypotension.  Continue patient on dopamine at this time.  Very likely patient's hypotension is related to medication she took at home.  We will be holding patient's blood pressure medications at this time.  We will wean her off dopamine as tolerated, keeping her MAP at 60 and above.  We will likely resume her blood pressure medications if her blood pressure rebounds.  2.  Coronary artery disease, seemed to be stable.  No complaints of pain.  We will get EKG if we have to.  3.  Asthma.  We will add DuoNebs.  We will continue ipratropium.  DuoNebs will be given as needed only.  4.  History of stroke with right eye vision disturbance.  Supportive therapy as well as aspirin therapy.  5.  History of hypertension.  We will resume blood pressure medications when the patient is able to take by mouth and her blood pressure rebounds.  6.  History of gastroesophageal reflux disease.  We will initiate PPI by mouth or IV.  7.  Status post left carotid endarterectomy.  The patient is intact neurologically.  We will follow her closely.    We will follow her along.  Thanks for consult.   TIME SPENT:  50 minutes.     ____________________________ Theodoro Grist, MD rv:ea D: 02/24/2013 17:31:49 ET T: 02/25/2013 03:53:41 ET JOB#: [id number]  cc: Theodoro Grist, MD, <Dictator> Dr. Nigel Mormon MD ELECTRONICALLY SIGNED 03/29/2013 19:34

## 2015-03-08 NOTE — Op Note (Signed)
PATIENT NAME:  Alyssa Murillo, TULEY MR#:  [id number] DATE OF BIRTH:  1946/10/03  DATE OF PROCEDURE:  02/24/2013  PREOPERATIVE DIAGNOSES:  Critical stenosis of the left internal carotid artery associated with kink of the left internal carotid artery and a secondary high grade 90% stenosis of the mid common carotid artery.   POSTOPERATIVE DIAGNOSIS:  Critical stenosis of the left internal carotid artery associated with kink of the left internal carotid artery and a secondary high grade 90% stenosis of the mid common carotid artery.   PROCEDURES PERFORMED: 1.  Left carotid endarterectomy with repair of arterial defect using CorMatrix patch.  2.  Repair of arterial defect with a xenograft CorMatrix patch. 3.  Plication of the left internal carotid artery to repair arterial kink.  4. Plication of the left common carotid artery to repair common carotid artery redundancy.   SURGEON:  Katha Cabal, M.D.   FIRST ASSISTANT:  Ms. Gillie Manners.   ANESTHESIA: General by endotracheal intubation.   FLUIDS:  Per anesthesia record.   ESTIMATED BLOOD LOSS:  100 mL.   SPECIMEN:  Carotid plaque to pathology for permanent section.   INDICATIONS:   The patient is a 69 year old woman who presented to the office approximately 6 months ago with multiple episodes of right eye visual changes. Workup demonstrated critical stenosis of the right internal carotid artery, as well as greater than 90% stenosis of the left common carotid artery and greater than 80% stenosis of the left internal carotid artery at its origin. Because the right side was symptomatic, this was treated first. She is now returning for treatment of the left side having undergone successful right carotid endarterectomy. Risks and benefits were reviewed in detail. All questions have been answered, and the patient agrees to proceed.   DESCRIPTION OF PROCEDURE:  The patient was taken to the operating room and placed in the supine position. After  adequate general anesthesia is induced and appropriate invasive monitors are placed, she is positioned supine with her neck extended slightly and rotated to the right. Neck and chest wall are prepped and draped in a sterile fashion.   A curvilinear incision is then created along the anterior margin of the sternocleidomastoid muscle and carried down through the soft tissues, transecting the platysma. Multiple venous tributaries of the jugular vein are identified and individually ligated with 2-0 and 3-0 silk ties. This included the facial vein. Because of the common carotid component, the dissection is carried significantly lower down on the neck exposing approximately 10 cm of the common carotid artery from the bulb proximal. This required complete transection of the omohyoid. The vagus and omohyoid nerves are identified as the dissection progresses, and the carotid bulb is dissected. Superior thyroid is actually quite small and ligated and divided between 2-0 silk ties. External carotid artery is looped at its origin with a silastic vessel loop. As the dissection is extended more proximally, the kink of the internal carotid artery is identified in order to achieve adequate visualization, as well as dissection of the distal internal carotid artery. The digastric was transected as well.   Once the carotid artery had been completely exposed, 7000 units of heparin was given and allowed to circulate for 4 minutes.   The common carotid, followed by the external, followed by the internal carotid artery are then clamped. Arteriotomy is made, extended both proximally and distally, transecting both lesions. An indwelling Sundt shunt was then placed and flow was re-established to the brain.   Endarterectomy was  then performed under direct visualization beginning in the bulb where an appropriate plane was identified, and then extending it down for approximately 4 to 5 cm into the common carotid. Hemorrhage was noted in  the common carotid lesion. The endarterectomy was then extended proximally, and the surrounding tissues from the internal were dissected a bit more completely, and the kink was then reduced. The external carotid artery was treated with the eversion technique.   After examining the redundancy that is now present within the artery, both in the common, as well as the internal, I elected to repair the kink with imbrication using 6 interrupted 7-0 Prolene sutures. The internal was then reduced in length by approximately 7 mm. After these individual sutures were completed, attention was turned to the common carotid, where again, the carotid artery was reduced in size, imbricated and then plicated using interrupted 6-0 Prolenes. On this occasion, approximately 15 mm of the tissue was imbricated. At the conclusion of this, both the proximal and distal intimal edge have now been incorporated into these repairs, and the carotid artery is laying in a fairly straight fashion. There is no further evidence of kink in the internal carotid.   CorMatrix patch is rehydrated on the back table and then delivered onto the field, beveled to an appropriate shape, and applied to repair the arterial defect using running 6-0 Prolene in a four-quadrant technique. Flushing maneuvers are performed. The shunt is removed, and the suture line is completed after copious irrigation.   Flow is then re-established to the external carotid artery, and then the internal carotid artery to prevent distal embolization. Several bleeding points along the suture line are easily controlled with interrupted 6-0 Prolene sutures, and then the wound is inspected for hemostasis. Surgicel is placed posterior to the artery.  Evicel is placed as well. The platysma is then reapproximated using running 3-0 Vicryl. Approximately 25 mg of Kenalog in 0.5% plain Marcaine is then infiltrated into the subdermal tissues, and the dermis is closed with running 4-0 Monocryl.  Dermabond is applied. The patient tolerated the procedure well. There were no immediate complications. Sponge and needle counts were correct. She was awakened in the Operating Room, following simple commands and moving all extremities.     ____________________________ Katha Cabal, MD ggs:dmm D: 02/24/2013 11:11:40 ET T: 02/24/2013 11:33:40 ET JOB#: [id number]  cc: Katha Cabal, MD, <Dictator> Ocie Cornfield. Ouida Sills, [place] MD ELECTRONICALLY SIGNED 03/27/2013 13:44

## 2015-03-08 NOTE — Consult Note (Signed)
Brief Consult Note: Diagnosis: hypotension, CAD, asthma, CVA with R eye vision disturbance, h/o HTN, s/p L CEA April 11th, 2014 byDR Schnier.   Patient was seen by consultant.   Consult note dictated.   Recommend further assessment or treatment.   Orders entered.   Discussed with Attending MD.   Comments: 1. Hypoetsnion, likley 2/2 pt BP meds , holding BP meds, following, likley resume tomorrow am, on dopamine, weaning off as tolearted keeping MAP >60 2. CAD, stable, no c/o pain 3. asthma, add duonebs prn, contineru tiotropium 4. CVA with R eye vision disturbance, supportive tehrapy, asa 5. h/o HTn, resume meds when able to take PO 6. GERD, PPI IV 7. L CEA, intact neurologically, following closely Thanks for consult, we'll follow.  Electronic Signatures: Theodoro Grist (MD)  (Signed 11-Apr-14 17:18)  Authored: Brief Consult Note   Last Updated: 11-Apr-14 17:18 by Theodoro Grist (MD)

## 2015-03-09 NOTE — Op Note (Signed)
PATIENT NAME:  Alyssa Murillo, ROMAS MR#:  [id number] DATE OF BIRTH:  06-08-1946  DATE OF PROCEDURE:  06/27/2014  PREOPERATIVE DIAGNOSIS: Right rotator cuff tear (supraspinatus).   POSTOPERATIVE DIAGNOSIS: Right rotator cuff tear (supraspinatus).   PROCEDURE PERFORMED:  1.  Right subacromial decompression. 2.  Rotator cuff repair.   SURGEON:  Laurice Record. Holley Bouche., MD  ANESTHESIA: Interscalene block and general.   ESTIMATED BLOOD LOSS: Minimal.   FLUIDS REPLACED: 1000 mL of crystalloid.   DRAINS: None.   IMPLANTS UTILIZED: ArthroCare Spartan 5.5 mm PEEK suture anchor.   INDICATIONS FOR SURGERY: The patient is a 69 year old female who has been seen for complaints of right shoulder pain and relative weakness. MRI demonstrated findings consistent with a tear of the supraspinatus tendon with some retraction. After discussion of the risks and benefits of surgical intervention, the patient expressed understanding of the risks and benefits, and agreed with plans for surgical intervention.   PROCEDURE IN DETAIL: The patient was brought to the operating room, and after adequate interscalene block and general anesthesia was achieved, the patient was placed in the modified beach chair position. The head was secured in headrest and all bony prominences were well padded. The patient's right shoulder and arm were cleaned and prepped with alcohol and DuraPrep and draped in the usual sterile fashion. A "timeout" was performed as per usual protocol. The anticipated incision site was injected with 0.25% Marcaine with epinephrine.   An anterior oblique incision was made roughly bisecting the anterior aspect of the acromion. The fibers of the deltoid were split in line and a "deltoid on" approach was utilized by elevating the deltoid in a subperiosteal fashion off of the acromion. The subacromial and subdeltoid bursa was noted to be extremely thickened and was excised. A Darrach retractor was inserted so as to  protect the cuff. An anterior-inferior wafer of bone was removed from the acromion using osteotome and rongeur. Additional debridement and contouring was performed using a TPS high-speed rasp. The wound was irrigated with copious amounts of normal saline with antibiotic solution. Full-thickness tear of the anterior portion of the supraspinatus was noted. A rongeur was used to create a bony trough along the greater tuberosity. The leading edge of the supraspinatus tendon was mobilized and anchored to the bone using an ArthroCare Spartan 5.5 mm PEEK suture anchor. The repair was performed with the associated #2 sutures.  Good closure was appreciated with good maintenance of the repair noted with placement of the arm through range of motion.   The wound was irrigated with copious amounts of normal saline with antibiotic solution. Good hemostasis was appreciated.  The deltoid was repaired in a side-to-side fashion using interrupted sutures of #2 Ethibond. The subcutaneous tissue was approximated in layers using first #0 Vicryl followed by #2-0 Vicryl.  Skin was closed with a running subcuticular suture of #4-0 Vicryl. Steri-Strips were applied followed by application of a sterile dressing.   The patient tolerated the procedure well. She was transported to the recovery room in stable condition.    ____________________________ Laurice Record. Holley Bouche., MD jph:LT D: 06/27/2014 15:22:51 ET T: 06/27/2014 17:08:01 ET JOB#: [id number]  cc: Jeneen Rinks P. Holley Bouche., MD, <Dictator>  Laurice Record Holley Bouche MD ELECTRONICALLY SIGNED 06/27/2014 21:48

## 2015-04-10 ENCOUNTER — Ambulatory Visit: Payer: Medicare Other

## 2015-04-10 ENCOUNTER — Encounter: Payer: Self-pay | Admitting: *Deleted

## 2015-05-01 ENCOUNTER — Inpatient Hospital Stay: Payer: Medicare Other | Attending: Obstetrics and Gynecology | Admitting: Obstetrics and Gynecology

## 2015-05-01 ENCOUNTER — Encounter: Payer: Self-pay | Admitting: Obstetrics and Gynecology

## 2015-05-01 VITALS — BP 102/65 | HR 63 | Temp 96.3°F | Resp 18 | Ht 60.0 in | Wt 182.1 lb

## 2015-05-01 DIAGNOSIS — M549 Dorsalgia, unspecified: Secondary | ICD-10-CM | POA: Insufficient documentation

## 2015-05-01 DIAGNOSIS — K219 Gastro-esophageal reflux disease without esophagitis: Secondary | ICD-10-CM | POA: Insufficient documentation

## 2015-05-01 DIAGNOSIS — M810 Age-related osteoporosis without current pathological fracture: Secondary | ICD-10-CM | POA: Diagnosis not present

## 2015-05-01 DIAGNOSIS — Z9221 Personal history of antineoplastic chemotherapy: Secondary | ICD-10-CM | POA: Insufficient documentation

## 2015-05-01 DIAGNOSIS — Z86718 Personal history of other venous thrombosis and embolism: Secondary | ICD-10-CM | POA: Insufficient documentation

## 2015-05-01 DIAGNOSIS — N958 Other specified menopausal and perimenopausal disorders: Secondary | ICD-10-CM | POA: Diagnosis not present

## 2015-05-01 DIAGNOSIS — R05 Cough: Secondary | ICD-10-CM

## 2015-05-01 DIAGNOSIS — K589 Irritable bowel syndrome without diarrhea: Secondary | ICD-10-CM | POA: Diagnosis not present

## 2015-05-01 DIAGNOSIS — I2510 Atherosclerotic heart disease of native coronary artery without angina pectoris: Secondary | ICD-10-CM | POA: Diagnosis not present

## 2015-05-01 DIAGNOSIS — R0602 Shortness of breath: Secondary | ICD-10-CM

## 2015-05-01 DIAGNOSIS — K5900 Constipation, unspecified: Secondary | ICD-10-CM | POA: Insufficient documentation

## 2015-05-01 DIAGNOSIS — R531 Weakness: Secondary | ICD-10-CM | POA: Diagnosis not present

## 2015-05-01 DIAGNOSIS — R200 Anesthesia of skin: Secondary | ICD-10-CM

## 2015-05-01 DIAGNOSIS — R140 Abdominal distension (gaseous): Secondary | ICD-10-CM | POA: Diagnosis not present

## 2015-05-01 DIAGNOSIS — N898 Other specified noninflammatory disorders of vagina: Secondary | ICD-10-CM | POA: Insufficient documentation

## 2015-05-01 DIAGNOSIS — Z8542 Personal history of malignant neoplasm of other parts of uterus: Secondary | ICD-10-CM | POA: Diagnosis present

## 2015-05-01 DIAGNOSIS — M7989 Other specified soft tissue disorders: Secondary | ICD-10-CM

## 2015-05-01 NOTE — Patient Instructions (Addendum)
Learn more about vaginal dryness and how to manage with Replens.   http://www.replens.com/?[email or web address]  Exercise to Lose Weight Exercise and a healthy diet may help you lose weight. Your doctor may suggest specific exercises. EXERCISE IDEAS AND TIPS  Choose low-cost things you enjoy doing, such as walking, bicycling, or exercising to workout videos.  Take stairs instead of the elevator.  Walk during your lunch break.  Park your car further away from work or school.  Go to a gym or an exercise class.  Start with 5 to 10 minutes of exercise each day. Build up to 30 minutes of exercise 4 to 6 days a week.  Wear shoes with good support and comfortable clothes.  Stretch before and after working out.  Work out until you breathe harder and your heart beats faster.  Drink extra water when you exercise.  Do not do so much that you hurt yourself, feel dizzy, or get very short of breath. Exercises that burn about 150 calories:  Running 1  miles in 15 minutes.  Playing volleyball for 45 to 60 minutes.  Washing and waxing a car for 45 to 60 minutes.  Playing touch football for 45 minutes.  Walking 1  miles in 35 minutes.  Pushing a stroller 1  miles in 30 minutes.  Playing basketball for 30 minutes.  Raking leaves for 30 minutes.  Bicycling 5 miles in 30 minutes.  Walking 2 miles in 30 minutes.  Dancing for 30 minutes.  Shoveling snow for 15 minutes.  Swimming laps for 20 minutes.  Walking up stairs for 15 minutes.  Bicycling 4 miles in 15 minutes.  Gardening for 30 to 45 minutes.  Jumping rope for 15 minutes.  Washing windows or floors for 45 to 60 minutes. Document Released: 12/05/2010 Document Revised: 01/25/2012 Document Reviewed: 12/05/2010 [facility] Patient Information 2015 [place], [place]. This information is not intended to replace advice given to you by your health care provider. Make sure you discuss any questions you have with  your health care provider.   Calorie Counting for Weight Loss Calories are energy you get from the things you eat and drink. Your body uses this energy to keep you going throughout the day. The number of calories you eat affects your weight. When you eat more calories than your body needs, your body stores the extra calories as fat. When you eat fewer calories than your body needs, your body burns fat to get the energy it needs. Calorie counting means keeping track of how many calories you eat and drink each day. If you make sure to eat fewer calories than your body needs, you should lose weight. In order for calorie counting to work, you will need to eat the number of calories that are right for you in a day to lose a healthy amount of weight per week. A healthy amount of weight to lose per week is usually 1-2 lb (0.5-0.9 kg). A dietitian can determine how many calories you need in a day and give you suggestions on how to reach your calorie goal.    WHAT DO I NEED TO KNOW ABOUT CALORIE COUNTING? In order to meet your daily calorie goal, you will need to:  Find out how many calories are in each food you would like to eat. Try to do this before you eat.  Decide how much of the food you can eat.  Write down what you ate and how many calories it had. Doing this is called keeping a food  log. WHERE DO I FIND CALORIE INFORMATION? The number of calories in a food can be found on a Nutrition Facts label. Note that all the information on a label is based on a specific serving of the food. If a food does not have a Nutrition Facts label, try to look up the calories online or ask your dietitian for help. HOW DO I DECIDE HOW MUCH TO EAT? To decide how much of the food you can eat, you will need to consider both the number of calories in one serving and the size of one serving. This information can be found on the Nutrition Facts label. If a food does not have a Nutrition Facts label, look up the information  online or ask your dietitian for help. Remember that calories are listed per serving. If you choose to have more than one serving of a food, you will have to multiply the calories per serving by the amount of servings you plan to eat. For example, the label on a package of bread might say that a serving size is 1 slice and that there are 90 calories in a serving. If you eat 1 slice, you will have eaten 90 calories. If you eat 2 slices, you will have eaten 180 calories. HOW DO I KEEP A FOOD LOG? After each meal, record the following information in your food log:  What you ate.  How much of it you ate.  How many calories it had.  Then, add up your calories. Keep your food log near you, such as in a small notebook in your pocket. Another option is to use a mobile app or website. Some programs will calculate calories for you and show you how many calories you have left each time you add an item to the log. WHAT ARE SOME CALORIE COUNTING TIPS?  Use your calories on foods and drinks that will fill you up and not leave you hungry. Some examples of this include foods like nuts and nut butters, vegetables, lean proteins, and high-fiber foods (more than 5 g fiber per serving).  Eat nutritious foods and avoid empty calories. Empty calories are calories you get from foods or beverages that do not have many nutrients, such as candy and soda. It is better to have a nutritious high-calorie food (such as an avocado) than a food with few nutrients (such as a bag of chips).  Know how many calories are in the foods you eat most often. This way, you do not have to look up how many calories they have each time you eat them.  Look out for foods that may seem like low-calorie foods but are really high-calorie foods, such as baked goods, soda, and fat-free candy.  Pay attention to calories in drinks. Drinks such as sodas, specialty coffee drinks, alcohol, and juices have a lot of calories yet do not fill you up.  Choose low-calorie drinks like water and diet drinks.  Focus your calorie counting efforts on higher calorie items. Logging the calories in a garden salad that contains only vegetables is less important than calculating the calories in a milk shake.  Find a way of tracking calories that works for you. Get creative. Most people who are successful find ways to keep track of how much they eat in a day, even if they do not count every calorie. WHAT ARE SOME PORTION CONTROL TIPS?  Know how many calories are in a serving. This will help you know how many servings of a certain  food you can have.  Use a measuring cup to measure serving sizes. This is helpful when you start out. With time, you will be able to estimate serving sizes for some foods.  Take some time to put servings of different foods on your favorite plates, bowls, and cups so you know what a serving looks like.  Try not to eat straight from a bag or box. Doing this can lead to overeating. Put the amount you would like to eat in a cup or on a plate to make sure you are eating the right portion.  Use smaller plates, glasses, and bowls to prevent overeating. This is a quick and easy way to practice portion control. If your plate is smaller, less food can fit on it.  Try not to multitask while eating, such as watching TV or using your computer. If it is time to eat, sit down at a table and enjoy your food. Doing this will help you to start recognizing when you are full. It will also make you more aware of what and how much you are eating. HOW CAN I CALORIE COUNT WHEN EATING OUT?  Ask for smaller portion sizes or child-sized portions.  Consider sharing an entree and sides instead of getting your own entree.  If you get your own entree, eat only half. Ask for a box at the beginning of your meal and put the rest of your entree in it so you are not tempted to eat it.  Look for the calories on the menu. If calories are listed, choose the lower  calorie options.  Choose dishes that include vegetables, fruits, whole grains, low-fat dairy products, and lean protein. Focusing on smart food choices from each of the 5 food groups can help you stay on track at restaurants.  Choose items that are boiled, broiled, grilled, or steamed.  Choose water, milk, unsweetened iced tea, or other drinks without added sugars. If you want an alcoholic beverage, choose a lower calorie option. For example, a regular margarita can have up to 700 calories and a glass of wine has around 150.  Stay away from items that are buttered, battered, fried, or served with cream sauce. Items labeled "crispy" are usually fried, unless stated otherwise.  Ask for dressings, sauces, and syrups on the side. These are usually very high in calories, so do not eat much of them.  Watch out for salads. Many people think salads are a healthy option, but this is often not the case. Many salads come with bacon, fried chicken, lots of cheese, fried chips, and dressing. All of these items have a lot of calories. If you want a salad, choose a garden salad and ask for grilled meats or steak. Ask for the dressing on the side, or ask for olive oil and vinegar or lemon to use as dressing.  Estimate how many servings of a food you are given. For example, a serving of cooked rice is  cup or about the size of half a tennis ball or one cupcake wrapper. Knowing serving sizes will help you be aware of how much food you are eating at restaurants. The list below tells you how big or small some common portion sizes are based on everyday objects.  1 oz--4 stacked dice.  3 oz--1 deck of cards.  1 tsp--1 dice.  1 Tbsp-- a Ping-Pong ball.  2 Tbsp--1 Ping-Pong ball.   cup--1 tennis ball or 1 cupcake wrapper.  1 cup--1 baseball. Document Released: 11/02/2005 Document  Revised: 03/19/2014 Document Reviewed: 09/07/2013 [facility] Patient Information 2015 [place], [place]. This information is not  intended to replace advice given to you by your health care provider. Make sure you discuss any questions you have with your health care provider.

## 2015-05-01 NOTE — Progress Notes (Signed)
Gynecologic Oncology Consult Visit   Referring Provider:  Dr Ouida Sills  Chief Concern: surveillance for endometrial cancer.    Subjective:  Alyssa Murillo is a 69 y.o. female who is seen for  surveillance for endometrial cancer.  She presents today with complaints of fatigue, shortness of breath, cough, abdominal bloating/constipation; back pain, leg swelling, numbness/tingling, and vaginal dryness. Shortness of breath and cough are chronic and due to allergies.    Gynecologic Oncology History  09/2007    Adenocarcinoma endometrium Stage I-B, T1bN0M0 (clear cell) FIGO grade 3.                      Carcinoma did not involve lower uterine segment or cervix. No LVI identified. Margins negative.                TAH BSO and staging                Carboplatin, Paclitaxel chemotherapy X 4.               Brachytherapy completed March 2009   Health care Maintenance: Her PCP orders her mammograms per her report. Virtual colonoscopy was 2012 and was negative.   Past Medical History: Past Medical History  Diagnosis Date  . Endometrial cancer 09/2007  . History of chemotherapy     carboplatin, paclitaxel x 4 cycles  . History of brachytherapy 01/2008    finished 2009  . Brachial vein thrombus, right 2005  . Carotid artery plaque 07/2012, 02/2013    removal of carotid artery plaque  . Osteoporosis     tx with evista started  . Depression   . Anemia   . Hyperlipidemia   . Colon polyps   . Esophagitis   . Gastritis   . GERD (gastroesophageal reflux disease)   . PAD (peripheral artery disease)   . PVD (peripheral vascular disease)   . CAD (coronary artery disease)   . Left ventricular hypertrophy   . Mitral valve regurgitation   . Tricuspid regurgitation   . Stented coronary artery 2004  . H/O colonoscopy 2014  . H/O mammogram 2015    Past Surgical History: Past Surgical History  Procedure Laterality Date  . Total abdominal hysterectomy w/ bilateral salpingoophorectomy   09/2007  . Femoral artery - popliteal artery bypass graft      right femoral graft  . Coronary artery bypass graft      stent placement in her circumflex coronary artery laminectomy  . Carotid endarterectomy  2014  . Rotator cuff repair  2015    Past Gynecologic History:  See HPI  OB History:  OB History  Gravida Para Term Preterm AB SAB TAB Ectopic Multiple Living  6 4            # Outcome Date GA Lbr Len/2nd Weight Sex Delivery Anes PTL Lv  6 Gravida           5 Gravida           4 Para           3 Para           2 Para           1 Para             Obstetric Comments  AGE AT [place] AT AGE 32  POST MENOPAUSAL AT AGE 45    Family History: Family History  Problem Relation Age of Onset  . Breast cancer  niece    Social History: History   Social History  . Marital Status: Single    Spouse Name: N/A  . Number of Children: N/A  . Years of Education: N/A   Occupational History  . Not on file.   Social History Main Topics  . Smoking status: Former Smoker -- 1.00 packs/day for 25 years    Types: Cigarettes    Quit date: 04/30/2006  . Smokeless tobacco: Not on file     Comment: quit 2007  . Alcohol Use: 1.2 oz/week    0 Standard drinks or equivalent, 1 Glasses of wine, 1 Cans of beer per week     Comment: socially  . Drug Use: No  . Sexual Activity: Yes   Other Topics Concern  . Not on file   Social History Narrative    Allergies: Allergies  Allergen Reactions  . Bisphosphonates Shortness Of Breath  . Codeine Itching  . Clopidogrel Bisulfate Other (See Comments)    Decreased blood count  . Lisinopril Cough    Current Medications: Current Outpatient Prescriptions  Medication Sig Dispense Refill  . albuterol (PROAIR HFA) 108 (90 BASE) MCG/ACT inhaler Inhale into the lungs.    Marland Kitchen aspirin 325 MG tablet Take 1 tablet by mouth daily.    . colchicine-probenecid 0.5-500 MG per tablet Take 1 tablet by mouth 2 (two) times daily as needed.    .  conjugated estrogens (PREMARIN) vaginal cream     . esomeprazole (NEXIUM) 40 MG capsule 1 tablet daily.    . furosemide (LASIX) 40 MG tablet Take 1 tablet by mouth daily.    Marland Kitchen gabapentin (NEURONTIN) 100 MG capsule Take 2 capsules by mouth 3 (three) times daily.    . isosorbide mononitrate (IMDUR) 30 MG 24 hr tablet Take 1 tablet by mouth daily.    Marland Kitchen olmesartan (BENICAR) 40 MG tablet Take 1 tablet by mouth daily.    . Probiotic Product (PROBIOTIC ADVANCED PO) Take 1 tablet by mouth 3 (three) times daily.    . propranolol (INDERAL) 80 MG tablet Take 1 tablet by mouth daily.    . rosuvastatin (CRESTOR) 20 MG tablet Take 1 tablet by mouth daily.    Marland Kitchen spironolactone (ALDACTONE) 25 MG tablet Take 0.5 tablets by mouth daily.    Marland Kitchen tiotropium (SPIRIVA HANDIHALER) 18 MCG inhalation capsule daily.    . traZODone (DESYREL) 100 MG tablet 1-2 tablets at bedtime as needed.     No current facility-administered medications for this visit.    Review of Systems 13 point ROS positive as noted in interval history.        Objective:  Physical Examination:  BP 102/65 mmHg  Pulse 63  Temp(Src) 96.3 F (35.7 C) (Tympanic)  Resp 18  Ht 5' (1.524 m)  Wt 182 lb 1.6 oz (82.6 kg)  BMI 35.56 kg/m2   ECOG Performance Status: 0 - Asymptomatic  General appearance: alert, cooperative and appears stated age 71: ATNC Lymph node survey: non-palpable, axillary, inguinal, supraclavicular Cardiovascular: RRR Respiratory: B CTA. Breast exam: negative for masses, nipple discharge, skin changes.  Abdomen: SNDNT. Incision all well healed. No hernias, no masses, no ascites.  Extremities:No edema Skin exam - Well healed incisions.  Neurological exam reveals alert, oriented, normal speech, no focal findings or movement disorder noted  Pelvic: Vulva: normal appearing vulva with no masses, tenderness or lesions; Vagina: normal; Adnexa: negative for masses or nodularity; Uterus and Cervix: surgically absent; Rectal:  patient declined.  Lab Review None  Radiologic Imaging:  None    Assessment:  Alyssa Murillo is a 69 y.o. female diagnosed with history of endometrial cancer. 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 GI symptoms are concerning but may be secondary to IBS/constipation rather than recurrence given long disease-free interval.  Mild XRT changes vaginal apex. BMI 35.56   Plan:   Problem List Items Addressed This Visit      Genitourinary   Vaginal dryness    Other Visit Diagnoses    History of endometrial cancer    -  Primary    Abdominal bloating        Relevant Orders    Ambulatory referral to Gastroenterology       I recommended referral to GI to assess symptoms and diagnosis of IBS. If they do not think her symptoms represent primary GI issue we will order a CT scan of the abdomen and pelvis.   If findings are negative for recurrence she can be released from [facility] clinic and resume care with Dr. Ouida Sills.  Suggested follow up in 12 months.   For vaginal dryness I provided information regarding Replens. She can also use coconut oil or vitamin E capsules. I recommended trying something other than estrogen cream since she has persistent symptoms. If these options don't work we will restart estrogen qhs application for 2 weeks on then 3 times a week.    We also discussed her weight and need for weight loss, stressed good nutrition, and exercise.   The patient's diagnosis, an outline of the further diagnostic and laboratory studies which will be required, the recommendation, and alternatives were discussed.  All questions were answered to the patient's satisfaction.   Gillis Ends, MD    CC:   Dr Ouida Sills

## 2015-05-15 DIAGNOSIS — I110 Hypertensive heart disease with heart failure: Secondary | ICD-10-CM | POA: Insufficient documentation

## 2015-05-24 ENCOUNTER — Other Ambulatory Visit: Payer: Self-pay | Admitting: Internal Medicine

## 2015-05-24 DIAGNOSIS — R1900 Intra-abdominal and pelvic swelling, mass and lump, unspecified site: Secondary | ICD-10-CM

## 2015-05-27 ENCOUNTER — Other Ambulatory Visit: Payer: Self-pay | Admitting: Internal Medicine

## 2015-05-27 ENCOUNTER — Ambulatory Visit
Admission: RE | Admit: 2015-05-27 | Discharge: 2015-05-27 | Disposition: A | Discharge status: Home / Self Care | Payer: Medicare Other | Source: Ambulatory Visit | Attending: Internal Medicine | Admitting: Internal Medicine

## 2015-05-27 DIAGNOSIS — R1900 Intra-abdominal and pelvic swelling, mass and lump, unspecified site: Secondary | ICD-10-CM

## 2015-07-19 ENCOUNTER — Other Ambulatory Visit: Payer: Self-pay | Admitting: Internal Medicine

## 2015-07-19 DIAGNOSIS — Z1231 Encounter for screening mammogram for malignant neoplasm of breast: Secondary | ICD-10-CM

## 2015-07-19 DIAGNOSIS — G5601 Carpal tunnel syndrome, right upper limb: Secondary | ICD-10-CM | POA: Insufficient documentation

## 2015-07-23 ENCOUNTER — Ambulatory Visit: Payer: Medicare Other

## 2015-07-24 ENCOUNTER — Ambulatory Visit
Admission: RE | Admit: 2015-07-24 | Discharge: 2015-07-24 | Disposition: A | Discharge status: Home / Self Care | Payer: Medicare Other | Source: Ambulatory Visit | Attending: Internal Medicine | Admitting: Internal Medicine

## 2015-07-24 ENCOUNTER — Other Ambulatory Visit: Payer: Self-pay | Admitting: Internal Medicine

## 2015-07-24 DIAGNOSIS — Z1231 Encounter for screening mammogram for malignant neoplasm of breast: Secondary | ICD-10-CM | POA: Insufficient documentation

## 2015-08-01 ENCOUNTER — Other Ambulatory Visit: Payer: Self-pay

## 2015-08-01 DIAGNOSIS — K589 Irritable bowel syndrome without diarrhea: Secondary | ICD-10-CM | POA: Insufficient documentation

## 2015-08-16 ENCOUNTER — Encounter
Admission: RE | Admit: 2015-08-16 | Discharge: 2015-08-16 | Disposition: A | Discharge status: Home / Self Care | Payer: Medicare Other | Source: Ambulatory Visit | Attending: Orthopedic Surgery | Admitting: Orthopedic Surgery

## 2015-08-16 DIAGNOSIS — Z01818 Encounter for other preprocedural examination: Secondary | ICD-10-CM | POA: Insufficient documentation

## 2015-08-16 DIAGNOSIS — G5601 Carpal tunnel syndrome, right upper limb: Secondary | ICD-10-CM | POA: Diagnosis not present

## 2015-08-16 DIAGNOSIS — I10 Essential (primary) hypertension: Secondary | ICD-10-CM | POA: Diagnosis not present

## 2015-08-16 LAB — CBC
HCT: 37.3 % (ref 35.0–47.0)
Hemoglobin: 12.1 g/dL (ref 12.0–16.0)
MCH: 26.8 pg (ref 26.0–34.0)
MCHC: 32.5 g/dL (ref 32.0–36.0)
MCV: 82.5 fL (ref 80.0–100.0)
Platelets: 181 10*3/uL (ref 150–440)
RBC: 4.53 MIL/uL (ref 3.80–5.20)
RDW: 14.6 % — ABNORMAL HIGH (ref 11.5–14.5)
WBC: 5.6 10*3/uL (ref 3.6–11.0)

## 2015-08-16 LAB — BASIC METABOLIC PANEL
Anion gap: 8 (ref 5–15)
BUN: 22 mg/dL — ABNORMAL HIGH (ref 6–20)
CO2: 26 mmol/L (ref 22–32)
Calcium: 10 mg/dL (ref 8.9–10.3)
Chloride: 105 mmol/L (ref 101–111)
Creatinine, Ser: 0.97 mg/dL (ref 0.44–1.00)
GFR calc Af Amer: >60 mL/min (ref >60)
GFR calc non Af Amer: 58 mL/min — ABNORMAL LOW (ref >60)
Glucose, Bld: 107 mg/dL — ABNORMAL HIGH (ref 65–99)
Potassium: 3.9 mmol/L (ref 3.5–5.1)
Sodium: 139 mmol/L (ref 135–145)

## 2015-08-16 LAB — DIFFERENTIAL
Basophils Absolute: 0.1 10*3/uL (ref 0–0.1)
Basophils Relative: 1 %
Eosinophils Absolute: 0.3 10*3/uL (ref 0–0.7)
Eosinophils Relative: 5 %
Lymphocytes Relative: 35 %
Lymphs Abs: 2 10*3/uL (ref 1.0–3.6)
Monocytes Absolute: 0.5 10*3/uL (ref 0.2–0.9)
Monocytes Relative: 9 %
Neutro Abs: 2.8 10*3/uL (ref 1.4–6.5)
Neutrophils Relative %: 50 %

## 2015-08-16 NOTE — Pre-Procedure Instructions (Signed)
Dr. Rosey Bath reviewed ECG's OK to proceed.

## 2015-08-16 NOTE — Patient Instructions (Signed)
  Your procedure is scheduled on: Friday Oct. 7, 2016. Report to Same Day Surgery. To find out your arrival time please call [phone] between 1PM - 3PM on Thursday cot. 6, 2016.  Remember: Instructions that are not followed completely may result in serious medical risk, up to and including death, or upon the discretion of your surgeon and anesthesiologist your surgery may need to be rescheduled.    _x___ 1. Do not eat food or drink liquids after midnight. No gum chewing or hard candies.     _x___ 2. No Alcohol for 24 hours before or after surgery.   ____ 3. Bring all medications with you on the day of surgery if instructed.    _x___ 4. Notify your doctor if there is any change in your medical condition     (cold, fever, infections).     Do not wear jewelry, make-up, hairpins, clips or nail polish.  Do not wear lotions, powders, or perfumes. You may wear deodorant.  Do not shave 48 hours prior to surgery. Men may shave face and neck.  Do not bring valuables to the hospital.    [facility] is not responsible for any belongings or valuables.               Contacts, dentures or bridgework may not be worn into surgery.  Leave your suitcase in the car. After surgery it may be brought to your room.  For patients admitted to the hospital, discharge time is determined by your treatment team.   Patients discharged the day of surgery will not be allowed to drive home.    Please read over the following fact sheets that you were given:   [facility] Preparing for Surgery  _x___ Take these medicines the morning of surgery with A SIP OF WATER:    1. esomeprazole (NEXIUM)  2. gabapentin (NEURONTIN)  3. isosorbide mononitrate (IMDUR)  4. olmesartan (BENICAR)  5. propranolol (INDERAL)  6.rosuvastatin (CRESTOR)  ____ Fleet Enema (as directed)   __x__ Use CHG Soap as directed  __x__ Use inhalers on the day of surgery and bring inhaler to hospital.  ____ Stop metformin 2 days prior to  surgery    ____ Take 1/2 of usual insulin dose the night before surgery and none on the morning of surgery.   _x___ Stop aspirin on today.  _x___ Stop Anti-inflammatories on today. Tylenol may be taken for pain.   _x___ Stop supplements until after surgery.  Include fish oil.  ____ Bring C-Pap to the hospital.

## 2015-08-16 NOTE — Pre-Procedure Instructions (Signed)
Pt's chart sent over to Anesthesia for review of today's ECG & to compare with prior ECG.

## 2015-08-23 ENCOUNTER — Ambulatory Visit
Admission: RE | Admit: 2015-08-23 | Discharge: 2015-08-23 | Disposition: A | Discharge status: Home / Self Care | Payer: Medicare Other | Source: Ambulatory Visit | Attending: Orthopedic Surgery | Admitting: Orthopedic Surgery

## 2015-08-23 ENCOUNTER — Ambulatory Visit: Payer: Medicare Other | Admitting: Anesthesiology

## 2015-08-23 ENCOUNTER — Encounter: Payer: Self-pay | Admitting: *Deleted

## 2015-08-23 ENCOUNTER — Encounter
Admission: RE | Disposition: A | Discharge status: Home / Self Care | Payer: Self-pay | Source: Ambulatory Visit | Attending: Orthopedic Surgery

## 2015-08-23 DIAGNOSIS — Z91048 Other nonmedicinal substance allergy status: Secondary | ICD-10-CM | POA: Diagnosis not present

## 2015-08-23 DIAGNOSIS — Z885 Allergy status to narcotic agent status: Secondary | ICD-10-CM | POA: Insufficient documentation

## 2015-08-23 DIAGNOSIS — D649 Anemia, unspecified: Secondary | ICD-10-CM | POA: Diagnosis not present

## 2015-08-23 DIAGNOSIS — I2510 Atherosclerotic heart disease of native coronary artery without angina pectoris: Secondary | ICD-10-CM | POA: Diagnosis not present

## 2015-08-23 DIAGNOSIS — K219 Gastro-esophageal reflux disease without esophagitis: Secondary | ICD-10-CM | POA: Diagnosis not present

## 2015-08-23 DIAGNOSIS — G5601 Carpal tunnel syndrome, right upper limb: Secondary | ICD-10-CM | POA: Insufficient documentation

## 2015-08-23 DIAGNOSIS — I739 Peripheral vascular disease, unspecified: Secondary | ICD-10-CM | POA: Insufficient documentation

## 2015-08-23 DIAGNOSIS — Z888 Allergy status to other drugs, medicaments and biological substances status: Secondary | ICD-10-CM | POA: Insufficient documentation

## 2015-08-23 DIAGNOSIS — Z7982 Long term (current) use of aspirin: Secondary | ICD-10-CM | POA: Insufficient documentation

## 2015-08-23 DIAGNOSIS — Z87891 Personal history of nicotine dependence: Secondary | ICD-10-CM | POA: Insufficient documentation

## 2015-08-23 HISTORY — PX: CARPAL TUNNEL RELEASE: SHX101

## 2015-08-23 SURGERY — CARPAL TUNNEL RELEASE
Anesthesia: Regional | Site: Hand | Laterality: Right | Wound class: Clean

## 2015-08-23 MED ORDER — FENTANYL CITRATE (PF) 100 MCG/2ML IJ SOLN: 25.0000 ug | INTRAMUSCULAR | Status: DC | PRN

## 2015-08-23 MED ORDER — BUPIVACAINE HCL (PF) 0.25 % IJ SOLN
INTRAMUSCULAR | Status: AC
  Filled 2015-08-23: qty 30

## 2015-08-23 MED ORDER — PHENYLEPHRINE HCL 10 MG/ML IJ SOLN
INTRAMUSCULAR | Status: DC | PRN
  Administered 2015-08-23 (×4): 100 ug via INTRAVENOUS

## 2015-08-23 MED ORDER — BUPIVACAINE HCL (PF) 0.25 % IJ SOLN
INTRAMUSCULAR | Status: DC | PRN
  Administered 2015-08-23: 10 mL

## 2015-08-23 MED ORDER — LIDOCAINE IN DEXTROSE 5-7.5 % IV SOLN
INTRAVENOUS | Status: DC | PRN
  Administered 2015-08-23 (×2): 20 mL via INTRATHECAL

## 2015-08-23 MED ORDER — NEOMYCIN-POLYMYXIN B GU 40-200000 IR SOLN
Status: AC
  Filled 2015-08-23: qty 2

## 2015-08-23 MED ORDER — PROPOFOL 500 MG/50ML IV EMUL
INTRAVENOUS | Status: DC | PRN
  Administered 2015-08-23: 25 ug/kg/min via INTRAVENOUS

## 2015-08-23 MED ORDER — HYDROCODONE-ACETAMINOPHEN 5-325 MG PO TABS
1.0000 | ORAL_TABLET | ORAL | Status: DC | PRN
  Administered 2015-08-23: 1 via ORAL

## 2015-08-23 MED ORDER — ACETAMINOPHEN 10 MG/ML IV SOLN
INTRAVENOUS | Status: AC
  Filled 2015-08-23: qty 100

## 2015-08-23 MED ORDER — FENTANYL CITRATE (PF) 100 MCG/2ML IJ SOLN
INTRAMUSCULAR | Status: DC | PRN
  Administered 2015-08-23 (×2): 50 ug via INTRAVENOUS

## 2015-08-23 MED ORDER — ONDANSETRON HCL 4 MG PO TABS: 4.0000 mg | ORAL_TABLET | Freq: Four times a day (QID) | ORAL | Status: DC | PRN

## 2015-08-23 MED ORDER — HYDROCODONE-ACETAMINOPHEN 5-325 MG PO TABS
ORAL_TABLET | ORAL | Status: AC
  Filled 2015-08-23: qty 1

## 2015-08-23 MED ORDER — ONDANSETRON HCL 4 MG/2ML IJ SOLN
INTRAMUSCULAR | Status: DC | PRN
  Administered 2015-08-23: 4 mg via INTRAVENOUS

## 2015-08-23 MED ORDER — METOCLOPRAMIDE HCL 5 MG/ML IJ SOLN: 5.0000 mg | Freq: Three times a day (TID) | INTRAMUSCULAR | Status: DC | PRN

## 2015-08-23 MED ORDER — LACTATED RINGERS IV SOLN
INTRAVENOUS | Status: DC
  Administered 2015-08-23: 50 mL/h via INTRAVENOUS

## 2015-08-23 MED ORDER — MIDAZOLAM HCL 2 MG/2ML IJ SOLN
INTRAMUSCULAR | Status: DC | PRN
  Administered 2015-08-23: 0.5 mg via INTRAVENOUS
  Administered 2015-08-23: 1 mg via INTRAVENOUS
  Administered 2015-08-23: 0.5 mg via INTRAVENOUS
  Administered 2015-08-23 (×2): 1 mg via INTRAVENOUS

## 2015-08-23 MED ORDER — NEOMYCIN-POLYMYXIN B GU 40-200000 IR SOLN
Status: DC | PRN
  Administered 2015-08-23: 2 mL

## 2015-08-23 MED ORDER — ONDANSETRON HCL 4 MG/2ML IJ SOLN: 4.0000 mg | Freq: Four times a day (QID) | INTRAMUSCULAR | Status: DC | PRN

## 2015-08-23 MED ORDER — SODIUM CHLORIDE 0.9 % IV SOLN: INTRAVENOUS | Status: DC

## 2015-08-23 MED ORDER — METOCLOPRAMIDE HCL 10 MG PO TABS: 5.0000 mg | ORAL_TABLET | Freq: Three times a day (TID) | ORAL | Status: DC | PRN

## 2015-08-23 MED ORDER — LIDOCAINE HCL (PF) 0.5 % IJ SOLN
INTRAMUSCULAR | Status: AC
  Filled 2015-08-23: qty 50

## 2015-08-23 MED ORDER — ONDANSETRON HCL 4 MG/2ML IJ SOLN: 4.0000 mg | Freq: Once | INTRAMUSCULAR | Status: DC | PRN

## 2015-08-23 MED ORDER — HYDROCODONE-ACETAMINOPHEN 5-325 MG PO TABS: 1.0000 | ORAL_TABLET | ORAL | Status: DC | PRN

## 2015-08-23 MED ORDER — ACETAMINOPHEN 10 MG/ML IV SOLN
INTRAVENOUS | Status: DC | PRN
  Administered 2015-08-23: 1000 mg via INTRAVENOUS

## 2015-08-23 SURGICAL SUPPLY — 28 items
BANDAGE ELASTIC 3 CLIP NS LF (GAUZE/BANDAGES/DRESSINGS) ×2 IMPLANT
BLADE SURG 15 STRL LF DISP TIS (BLADE) ×1 IMPLANT
BLADE SURG 15 STRL SS (BLADE) ×1
BNDG ESMARK 4X12 TAN STRL LF (GAUZE/BANDAGES/DRESSINGS) ×2 IMPLANT
CANISTER SUCT 1200ML W/VALVE (MISCELLANEOUS) ×2 IMPLANT
CAST PADDING 3X4FT ST 30246 (SOFTGOODS) ×1
DRSG DERMACEA 8X12 NADH (GAUZE/BANDAGES/DRESSINGS) ×2 IMPLANT
DURAPREP 26ML APPLICATOR (WOUND CARE) ×2 IMPLANT
ELECT CAUTERY BLADE 6.4 (BLADE) ×2 IMPLANT
GAUZE SPONGE 4X4 12PLY STRL (GAUZE/BANDAGES/DRESSINGS) ×2 IMPLANT
GLOVE BIO SURGEON STRL SZ7 (GLOVE) ×2 IMPLANT
GLOVE BIOGEL M STRL SZ7.5 (GLOVE) ×2 IMPLANT
GLOVE INDICATOR 7.5 STRL GRN (GLOVE) ×2 IMPLANT
GLOVE INDICATOR 8.0 STRL GRN (GLOVE) ×2 IMPLANT
GOWN STRL REUS W/ TWL LRG LVL3 (GOWN DISPOSABLE) ×2 IMPLANT
GOWN STRL REUS W/ TWL LRG LVL4 (GOWN DISPOSABLE) ×2 IMPLANT
GOWN STRL REUS W/TWL LRG LVL3 (GOWN DISPOSABLE) ×2
GOWN STRL REUS W/TWL LRG LVL4 (GOWN DISPOSABLE) ×2
NS IRRIG 500ML POUR BTL (IV SOLUTION) ×2 IMPLANT
PACK EXTREMITY ARMC (MISCELLANEOUS) ×2 IMPLANT
PAD CAST CTTN 3X4 STRL (SOFTGOODS) ×1 IMPLANT
PAD GROUND ADULT SPLIT (MISCELLANEOUS) ×2 IMPLANT
SOL PREP PVP 2OZ (MISCELLANEOUS) ×2
SOLUTION PREP PVP 2OZ (MISCELLANEOUS) ×1 IMPLANT
SPLINT CAST 1 STEP 3X12 (MISCELLANEOUS) ×2 IMPLANT
STOCKINETTE STRL 4IN 9604848 (GAUZE/BANDAGES/DRESSINGS) ×2 IMPLANT
STRAP SAFETY BODY (MISCELLANEOUS) ×2 IMPLANT
SUT ETHILON 5-0 FS-2 18 BLK (SUTURE) ×2 IMPLANT

## 2015-08-23 NOTE — Op Note (Signed)
OPERATIVE NOTE  DATE OF SURGERY:  08/23/2015  PATIENT NAME:  Alyssa BACIGALUPO   DOB: February 20, 1946  MRN: [id number]  PRE-OPERATIVE DIAGNOSIS: Right carpal tunnel syndrome  POST-OPERATIVE DIAGNOSIS:  Same  PROCEDURE:  Right carpal tunnel release  SURGEON:  Marciano Sequin. M.D.  ANESTHESIA: Bier block  ESTIMATED BLOOD LOSS: minimal  FLUIDS REPLACED: 700 mL of crystalloid  TOURNIQUET TIME: 47 minutes  DRAINS: None  INDICATIONS FOR SURGERY: MELODY CIRRINCIONE is a 69 y.o. year old female with a long history of numbness and paresthesias to the right hand. EMG/nerve conduction studies demonstrated findings consistent with carpal tunnel syndrome.The patient had not seen any significant improvement despite conservative nonsurgical intervention. After discussion of the risks and benefits of surgical intervention, the patient expressed understanding of the risks benefits and agree with plans for carpal tunnel release.   PROCEDURE IN DETAIL: The patient was brought into the operating room and after adequate Bier block anesthesia, a tourniquet was placed on the patient's right upper arm.The right hand and arm were prepped with alcohol and Duraprep and draped in the usual sterile fashion. A "time-out" was performed as per usual protocol. The hand and forearm were exsanguinated using an Esmarch and the tourniquet was inflated to 250 mmHg. Loupe magnification was used throughout the procedure. An incision was made just ulnar to the thenar palmar crease. Dissection was carried down through the palmar fascia to the transverse carpal ligament. The transverse carpal ligament was sharply incised, taking care to protect the underlying structures with the carpal tunnel. Complete release of the transverse carpal ligament was achieved. There was no evidence of ganglion cyst or lipoma within the carpal tunnel. The wound was irrigated with copious amounts of normal saline with antibiotic solution. The skin was  then re-approximated with interrupted sutures of #5-0 nylon. A sterile dressing was applied followed by application of a volar splint. The tourniquet was deflated with a total tourniquet time of 47 minutes.  The patient tolerated the procedure well and was transported to the PACU in stable condition.  James P. Holley Bouche., M.D.

## 2015-08-23 NOTE — Transfer of Care (Signed)
Immediate Anesthesia Transfer of Care Note  Patient: Alyssa Murillo  Procedure(s) Performed: Procedure(s): CARPAL TUNNEL RELEASE (Right)  Patient Location: PACU  Anesthesia Type:Regional  Level of Consciousness: awake, alert  and oriented  Airway & Oxygen Therapy: Patient Spontanous Breathing and Patient connected to face mask oxygen  Post-op Assessment: Report given to RN and Post -op Vital signs reviewed and stable  Post vital signs: Reviewed and stable  Last Vitals:  Filed Vitals:   08/23/15 0830  BP: 150/86  Pulse: 99  Temp: 36.4 C  Resp: 16    Complications: No apparent anesthesia complications

## 2015-08-23 NOTE — Brief Op Note (Signed)
08/23/2015  10:53 AM  PATIENT:  Alyssa Murillo  69 y.o. female  PRE-OPERATIVE DIAGNOSIS:  CARPAL TUNNEL SYNDROME, right   POST-OPERATIVE DIAGNOSIS:  CARPAL TUNNEL SYNDROME, right   PROCEDURE:  Procedure(s): CARPAL TUNNEL RELEASE (Right)  SURGEON:  Surgeon(s) and Role:    * Dereck Leep, MD - Primary  ASSISTANTS: none   ANESTHESIA:   Bier block  EBL:  Total I/O In: 700 [I.V.:700] Out: -   BLOOD ADMINISTERED:none  DRAINS: none   LOCAL MEDICATIONS USED:  MARCAINE     SPECIMEN:  No Specimen  DISPOSITION OF SPECIMEN:  N/A  COUNTS:  YES  TOURNIQUET: 47 minutes  DICTATION: .Dragon Dictation  PLAN OF CARE: Discharge to home after PACU  PATIENT DISPOSITION:  PACU - hemodynamically stable.   Delay start of Pharmacological VTE agent (>24hrs) due to surgical blood loss or risk of bleeding: not applicable

## 2015-08-23 NOTE — H&P (Signed)
The patient has been re-examined, and the chart reviewed, and there have been no interval changes to the documented history and physical.    The risks, benefits, and alternatives have been discussed at length. The patient expressed understanding of the risks benefits and agreed with plans for surgical intervention.  James P. Hooten, Jr. M.D.    

## 2015-08-23 NOTE — Discharge Instructions (Signed)
°  Instructions after Hand / Wrist Surgery   James P. Holley Bouche., M.D.  Dept. of [facility]  [address] [place], [place]  [address]   Phone: [phone]   Fax: [phone]   DIET:  Drink plenty of non-alcoholic fluids & begin a light diet.  Resume your normal diet the day after surgery.  ACTIVITY:   Keep the hand elevated above the level of the elbow.  Begin gently moving the fingers on a regular basis to avoid stiffness.  Avoid any heavy lifting, pushing, or pulling with the operative hand.  Do not drive or operate any equipment until instructed.  WOUND CARE:   Keep the splint/bandage clean and dry.   The splint and stitches will be removed in the office.  Continue to use the ice packs periodically to reduce pain and swelling.  You may bathe or shower after the stitches are removed at the first office visit following surgery.  MEDICATIONS:  You may resume your regular medications.  Please take the pain medication as prescribed.  Do not take pain medication on an empty stomach.  Do not drive or drink alcoholic beverages when taking pain medications.  CALL THE OFFICE FOR:  Temperature above 101 degrees  Excessive bleeding or drainage on the dressing.  Excessive swelling, coldness, or paleness of the fingers.  Persistent nausea and vomiting.  FOLLOW-UP:   You should have an appointment to return to the office in 7-10 days after surgery.   REMEMBER: R.I.C.E. = Rest, Ice, Compression, Elevation !AMBULATORY SURGERY  DISCHARGE INSTRUCTIONS   1) The drugs that you were given will stay in your system until tomorrow so for the next 24 hours you should not:  A) Drive an automobile B) Make any legal decisions C) Drink any alcoholic beverage   2) You may resume regular meals tomorrow.  Today it is better to start with liquids and gradually work up to solid foods.  You may eat anything you prefer, but it is  better to start with liquids, then soup and crackers, and gradually work up to solid foods.   3) Please notify your doctor immediately if you have any unusual bleeding, trouble breathing, redness and pain at the surgery site, drainage, fever, or pain not relieved by medication.  Follow up appointment with Helmut Muster on Friday August 30, 2015 at 1:45 pm.   Signed__________________________date_____________________________

## 2015-08-23 NOTE — Anesthesia Postprocedure Evaluation (Signed)
  Anesthesia Post-op Note  Patient: Alyssa Murillo  Procedure(s) Performed: Procedure(s): CARPAL TUNNEL RELEASE (Right)  Anesthesia type:Bier Block  Patient location: PACU  Post pain: Pain level controlled  Post assessment: Post-op Vital signs reviewed, Patient's Cardiovascular Status Stable, Respiratory Function Stable, Patent Airway and No signs of Nausea or vomiting  Post vital signs: Reviewed and stable  Last Vitals:  Filed Vitals:   08/23/15 1212  BP: 119/62  Pulse: 52  Temp: 36.7 C  Resp: 16    Level of consciousness: awake, alert  and patient cooperative  Complications: No apparent anesthesia complications

## 2015-08-23 NOTE — Anesthesia Preprocedure Evaluation (Signed)
Anesthesia Evaluation  Patient identified by MRN, date of birth, ID band Patient awake    Reviewed: Allergy & Precautions, H&P , NPO status , Patient's Chart, lab work & pertinent test results, reviewed documented beta blocker date and time   Airway Mallampati: II  TM Distance: >3 FB Neck ROM: full    Dental  (+) Teeth Intact, Poor Dentition, Missing   Pulmonary neg pulmonary ROS, former smoker,    Pulmonary exam normal        Cardiovascular Exercise Tolerance: Good + CAD and + Peripheral Vascular Disease  negative cardio ROS Normal cardiovascular exam Rate:Normal     Neuro/Psych PSYCHIATRIC DISORDERS negative neurological ROS  negative psych ROS   GI/Hepatic negative GI ROS, Neg liver ROS, GERD  ,  Endo/Other  negative endocrine ROS  Renal/GU negative Renal ROS  negative genitourinary   Musculoskeletal   Abdominal   Peds  Hematology negative hematology ROS (+) anemia ,   Anesthesia Other Findings   Reproductive/Obstetrics negative OB ROS                             Anesthesia Physical Anesthesia Plan  ASA: III  Anesthesia Plan: Bier Block   Post-op Pain Management:    Induction:   Airway Management Planned:   Additional Equipment:   Intra-op Plan:   Post-operative Plan:   Informed Consent: I have reviewed the patients History and Physical, chart, labs and discussed the procedure including the risks, benefits and alternatives for the proposed anesthesia with the patient or authorized representative who has indicated his/her understanding and acceptance.     Plan Discussed with: CRNA  Anesthesia Plan Comments:         Anesthesia Quick Evaluation

## 2015-09-20 DIAGNOSIS — I70219 Atherosclerosis of native arteries of extremities with intermittent claudication, unspecified extremity: Secondary | ICD-10-CM | POA: Insufficient documentation

## 2015-11-21 ENCOUNTER — Other Ambulatory Visit: Payer: Self-pay | Admitting: Orthopedic Surgery

## 2015-11-21 DIAGNOSIS — M5412 Radiculopathy, cervical region: Secondary | ICD-10-CM

## 2015-12-12 ENCOUNTER — Ambulatory Visit
Admission: RE | Admit: 2015-12-12 | Discharge: 2015-12-12 | Disposition: A | Discharge status: Home / Self Care | Payer: Medicare Other | Source: Ambulatory Visit | Attending: Orthopedic Surgery | Admitting: Orthopedic Surgery

## 2015-12-12 DIAGNOSIS — M47812 Spondylosis without myelopathy or radiculopathy, cervical region: Secondary | ICD-10-CM | POA: Diagnosis not present

## 2015-12-12 DIAGNOSIS — M50222 Other cervical disc displacement at C5-C6 level: Secondary | ICD-10-CM | POA: Diagnosis not present

## 2015-12-12 DIAGNOSIS — M50221 Other cervical disc displacement at C4-C5 level: Secondary | ICD-10-CM | POA: Insufficient documentation

## 2015-12-12 DIAGNOSIS — M4802 Spinal stenosis, cervical region: Secondary | ICD-10-CM | POA: Diagnosis not present

## 2015-12-12 DIAGNOSIS — M50223 Other cervical disc displacement at C6-C7 level: Secondary | ICD-10-CM | POA: Insufficient documentation

## 2015-12-12 DIAGNOSIS — M5412 Radiculopathy, cervical region: Secondary | ICD-10-CM | POA: Diagnosis present

## 2015-12-12 DIAGNOSIS — M79642 Pain in left hand: Secondary | ICD-10-CM | POA: Diagnosis present

## 2015-12-12 DIAGNOSIS — M2578 Osteophyte, vertebrae: Secondary | ICD-10-CM | POA: Insufficient documentation

## 2015-12-12 MED ORDER — GADOBENATE DIMEGLUMINE 529 MG/ML IV SOLN
20.0000 mL | Freq: Once | INTRAVENOUS | Status: AC | PRN
  Administered 2015-12-12: 16 mL via INTRAVENOUS

## 2015-12-13 LAB — POCT I-STAT CREATININE: Creatinine, Ser: 1.2 mg/dL — ABNORMAL HIGH (ref 0.44–1.00)

## 2016-01-31 ENCOUNTER — Other Ambulatory Visit: Payer: Self-pay | Admitting: *Deleted

## 2016-01-31 MED ORDER — ESTROGENS, CONJUGATED 0.625 MG/GM VA CREA: 1.0000 | TOPICAL_CREAM | VAGINAL | Status: DC

## 2016-03-26 DIAGNOSIS — Z0000 Encounter for general adult medical examination without abnormal findings: Secondary | ICD-10-CM | POA: Insufficient documentation

## 2016-03-30 ENCOUNTER — Other Ambulatory Visit: Payer: Self-pay | Admitting: Physical Medicine and Rehabilitation

## 2016-03-30 DIAGNOSIS — M5416 Radiculopathy, lumbar region: Secondary | ICD-10-CM

## 2016-04-20 ENCOUNTER — Ambulatory Visit
Admission: RE | Admit: 2016-04-20 | Discharge: 2016-04-20 | Disposition: A | Discharge status: Home / Self Care | Payer: Medicare Other | Source: Ambulatory Visit | Attending: Physical Medicine and Rehabilitation | Admitting: Physical Medicine and Rehabilitation

## 2016-04-20 DIAGNOSIS — M5186 Other intervertebral disc disorders, lumbar region: Secondary | ICD-10-CM | POA: Insufficient documentation

## 2016-04-20 DIAGNOSIS — M1288 Other specific arthropathies, not elsewhere classified, other specified site: Secondary | ICD-10-CM | POA: Insufficient documentation

## 2016-04-20 DIAGNOSIS — M5416 Radiculopathy, lumbar region: Secondary | ICD-10-CM

## 2016-04-28 ENCOUNTER — Other Ambulatory Visit: Payer: Self-pay | Admitting: Internal Medicine

## 2016-04-28 DIAGNOSIS — I6523 Occlusion and stenosis of bilateral carotid arteries: Secondary | ICD-10-CM

## 2016-04-29 DIAGNOSIS — I779 Disorder of arteries and arterioles, unspecified: Secondary | ICD-10-CM | POA: Insufficient documentation

## 2016-05-12 ENCOUNTER — Other Ambulatory Visit: Payer: Self-pay | Admitting: Vascular Surgery

## 2016-05-12 DIAGNOSIS — I6523 Occlusion and stenosis of bilateral carotid arteries: Secondary | ICD-10-CM

## 2016-05-22 ENCOUNTER — Ambulatory Visit
Admission: RE | Admit: 2016-05-22 | Discharge: 2016-05-22 | Disposition: A | Discharge status: Home / Self Care | Payer: Medicare Other | Source: Ambulatory Visit | Attending: Vascular Surgery | Admitting: Vascular Surgery

## 2016-05-22 DIAGNOSIS — I6501 Occlusion and stenosis of right vertebral artery: Secondary | ICD-10-CM | POA: Insufficient documentation

## 2016-05-22 DIAGNOSIS — I700 Atherosclerosis of aorta: Secondary | ICD-10-CM | POA: Diagnosis not present

## 2016-05-22 DIAGNOSIS — I672 Cerebral atherosclerosis: Secondary | ICD-10-CM | POA: Diagnosis not present

## 2016-05-22 DIAGNOSIS — I6523 Occlusion and stenosis of bilateral carotid arteries: Secondary | ICD-10-CM | POA: Insufficient documentation

## 2016-05-22 HISTORY — DX: Essential (primary) hypertension: I10

## 2016-05-22 MED ORDER — IOPAMIDOL (ISOVUE-370) INJECTION 76%
75.0000 mL | Freq: Once | INTRAVENOUS | Status: AC | PRN
  Administered 2016-05-22: 75 mL via INTRAVENOUS

## 2016-05-26 LAB — POCT I-STAT CREATININE: Creatinine, Ser: 1 mg/dL (ref 0.44–1.00)

## 2016-06-01 ENCOUNTER — Other Ambulatory Visit: Payer: Self-pay | Admitting: Vascular Surgery

## 2016-07-01 ENCOUNTER — Encounter: Payer: Self-pay | Admitting: *Deleted

## 2016-07-01 ENCOUNTER — Ambulatory Visit
Admission: RE | Admit: 2016-07-01 | Discharge: 2016-07-01 | Disposition: A | Discharge status: Home / Self Care | Payer: Medicare Other | Source: Ambulatory Visit | Attending: Vascular Surgery | Admitting: Vascular Surgery

## 2016-07-01 ENCOUNTER — Encounter
Admission: RE | Disposition: A | Discharge status: Home / Self Care | Payer: Self-pay | Source: Ambulatory Visit | Attending: Vascular Surgery

## 2016-07-01 DIAGNOSIS — M7989 Other specified soft tissue disorders: Secondary | ICD-10-CM | POA: Diagnosis not present

## 2016-07-01 DIAGNOSIS — R42 Dizziness and giddiness: Secondary | ICD-10-CM | POA: Diagnosis not present

## 2016-07-01 DIAGNOSIS — Z885 Allergy status to narcotic agent status: Secondary | ICD-10-CM | POA: Diagnosis not present

## 2016-07-01 DIAGNOSIS — Z8249 Family history of ischemic heart disease and other diseases of the circulatory system: Secondary | ICD-10-CM | POA: Insufficient documentation

## 2016-07-01 DIAGNOSIS — E785 Hyperlipidemia, unspecified: Secondary | ICD-10-CM | POA: Insufficient documentation

## 2016-07-01 DIAGNOSIS — I6522 Occlusion and stenosis of left carotid artery: Secondary | ICD-10-CM | POA: Diagnosis present

## 2016-07-01 DIAGNOSIS — Z7982 Long term (current) use of aspirin: Secondary | ICD-10-CM | POA: Insufficient documentation

## 2016-07-01 DIAGNOSIS — Z8542 Personal history of malignant neoplasm of other parts of uterus: Secondary | ICD-10-CM | POA: Diagnosis not present

## 2016-07-01 DIAGNOSIS — Z6835 Body mass index (BMI) 35.0-35.9, adult: Secondary | ICD-10-CM | POA: Insufficient documentation

## 2016-07-01 DIAGNOSIS — Z90710 Acquired absence of both cervix and uterus: Secondary | ICD-10-CM | POA: Insufficient documentation

## 2016-07-01 DIAGNOSIS — Z87891 Personal history of nicotine dependence: Secondary | ICD-10-CM | POA: Insufficient documentation

## 2016-07-01 DIAGNOSIS — I70219 Atherosclerosis of native arteries of extremities with intermittent claudication, unspecified extremity: Secondary | ICD-10-CM | POA: Diagnosis not present

## 2016-07-01 DIAGNOSIS — Z809 Family history of malignant neoplasm, unspecified: Secondary | ICD-10-CM | POA: Insufficient documentation

## 2016-07-01 DIAGNOSIS — Z888 Allergy status to other drugs, medicaments and biological substances status: Secondary | ICD-10-CM | POA: Insufficient documentation

## 2016-07-01 DIAGNOSIS — Z823 Family history of stroke: Secondary | ICD-10-CM | POA: Insufficient documentation

## 2016-07-01 DIAGNOSIS — I10 Essential (primary) hypertension: Secondary | ICD-10-CM | POA: Insufficient documentation

## 2016-07-01 DIAGNOSIS — Z8342 Family history of familial hypercholesterolemia: Secondary | ICD-10-CM | POA: Diagnosis not present

## 2016-07-01 HISTORY — PX: PERIPHERAL VASCULAR CATHETERIZATION: SHX172C

## 2016-07-01 LAB — CREATININE, SERUM
Creatinine, Ser: 0.91 mg/dL (ref 0.44–1.00)
GFR calc Af Amer: >60 mL/min (ref >60)
GFR calc non Af Amer: >60 mL/min (ref >60)

## 2016-07-01 LAB — BUN: BUN: 25 mg/dL — ABNORMAL HIGH (ref 6–20)

## 2016-07-01 SURGERY — CAROTID PTA/STENT INTERVENTION
Anesthesia: Moderate Sedation | Laterality: Left

## 2016-07-01 MED ORDER — HEPARIN SODIUM (PORCINE) 1000 UNIT/ML IJ SOLN
INTRAMUSCULAR | Status: AC
  Filled 2016-07-01: qty 1

## 2016-07-01 MED ORDER — LIDOCAINE HCL (PF) 1 % IJ SOLN
INTRAMUSCULAR | Status: AC
  Filled 2016-07-01: qty 30

## 2016-07-01 MED ORDER — HYDROMORPHONE HCL 1 MG/ML IJ SOLN: 1.0000 mg | Freq: Once | INTRAMUSCULAR | Status: DC

## 2016-07-01 MED ORDER — HEPARIN SODIUM (PORCINE) 5000 UNIT/ML IJ SOLN
1000.0000 [IU] | Freq: Once | INTRAMUSCULAR | Status: AC
  Administered 2016-07-01: 1000 [IU] via INTRAVENOUS

## 2016-07-01 MED ORDER — ONDANSETRON HCL 4 MG/2ML IJ SOLN: 4.0000 mg | Freq: Four times a day (QID) | INTRAMUSCULAR | Status: DC | PRN

## 2016-07-01 MED ORDER — HEPARIN (PORCINE) IN NACL 2-0.9 UNIT/ML-% IJ SOLN
INTRAMUSCULAR | Status: AC
Start: 2016-07-01 — End: 2016-07-01
  Filled 2016-07-01: qty 1000

## 2016-07-01 MED ORDER — FENTANYL CITRATE (PF) 100 MCG/2ML IJ SOLN
INTRAMUSCULAR | Status: DC | PRN
  Administered 2016-07-01 (×3): 50 ug via INTRAVENOUS

## 2016-07-01 MED ORDER — IOPAMIDOL (ISOVUE-300) INJECTION 61%
INTRAVENOUS | Status: DC | PRN
  Administered 2016-07-01: 60 mL via INTRA_ARTERIAL

## 2016-07-01 MED ORDER — PHENYLEPHRINE HCL 10 MG/ML IJ SOLN
INTRAMUSCULAR | Status: AC
  Filled 2016-07-01: qty 1

## 2016-07-01 MED ORDER — ATROPINE SULFATE 1 MG/10ML IJ SOSY
PREFILLED_SYRINGE | INTRAMUSCULAR | Status: AC
  Filled 2016-07-01: qty 10

## 2016-07-01 MED ORDER — HEPARIN SODIUM (PORCINE) 1000 UNIT/ML IJ SOLN
INTRAMUSCULAR | Status: DC | PRN
  Administered 2016-07-01: 7000 [IU] via INTRAVENOUS

## 2016-07-01 MED ORDER — SODIUM CHLORIDE FLUSH 0.9 % IV SOLN
INTRAVENOUS | Status: AC
  Filled 2016-07-01: qty 30

## 2016-07-01 MED ORDER — CEFUROXIME SODIUM 1.5 G IJ SOLR
1.5000 g | INTRAMUSCULAR | Status: AC
  Administered 2016-07-01: 1.5 g via INTRAVENOUS

## 2016-07-01 MED ORDER — MIDAZOLAM HCL 2 MG/2ML IJ SOLN
INTRAMUSCULAR | Status: DC | PRN
  Administered 2016-07-01 (×2): 1 mg via INTRAVENOUS
  Administered 2016-07-01: 2 mg via INTRAVENOUS

## 2016-07-01 MED ORDER — MIDAZOLAM HCL 5 MG/5ML IJ SOLN
INTRAMUSCULAR | Status: AC
  Filled 2016-07-01: qty 5

## 2016-07-01 MED ORDER — METHYLPREDNISOLONE SODIUM SUCC 125 MG IJ SOLR: 125.0000 mg | INTRAMUSCULAR | Status: DC | PRN

## 2016-07-01 MED ORDER — SODIUM CHLORIDE 0.9 % IV SOLN: INTRAVENOUS | Status: DC

## 2016-07-01 MED ORDER — FENTANYL CITRATE (PF) 100 MCG/2ML IJ SOLN
INTRAMUSCULAR | Status: AC
  Filled 2016-07-01: qty 4

## 2016-07-01 MED ORDER — FAMOTIDINE 20 MG PO TABS: 40.0000 mg | ORAL_TABLET | ORAL | Status: DC | PRN

## 2016-07-01 SURGICAL SUPPLY — 24 items
CANNULA 5F STIFF (CANNULA) ×2 IMPLANT
CATH ANGIO 5F 100CM .035 PIG (CATHETERS) ×2 IMPLANT
CATH CXI SUPP ST 2.6FR 150CM (MICROCATHETER) ×2 IMPLANT
CATH CXI SUPP ST 4FR 135CM (MICROCATHETER) ×2 IMPLANT
CATH G 5FX100 (CATHETERS) ×2 IMPLANT
CATH H1 100CM (CATHETERS) ×2 IMPLANT
CATH INFINITI 5 FR IM (CATHETERS) ×2 IMPLANT
CATH INFINITI 5FR AL1 (CATHETERS) ×2 IMPLANT
CATH PERFORMA SIM1 100CM (CATHETERS) ×2 IMPLANT
CATH RIM 65CM (CATHETERS) ×2 IMPLANT
DEVICE CLOSURE MYNXGRIP 6/7F (Vascular Products) ×4 IMPLANT
DEVICE PRESTO INFLATION (MISCELLANEOUS) ×4 IMPLANT
GLIDEWIRE ADV .035X260CM (WIRE) ×2 IMPLANT
GLIDEWIRE ANGLED SS 035X260CM (WIRE) ×2 IMPLANT
GUIDEWIRE AMPLATZ SS .035X260 (WIRE) ×2 IMPLANT
GUIDEWIRE ANGLED .035X260CM (WIRE) ×2 IMPLANT
GUIDEWIRE PFTE-COATED .018X300 (WIRE) ×2 IMPLANT
GUIDEWIRE SUPER STIFF .035X180 (WIRE) ×2 IMPLANT
KIT CAROTID MANIFOLD (MISCELLANEOUS) ×2 IMPLANT
PACK ANGIOGRAPHY (CUSTOM PROCEDURE TRAY) ×2 IMPLANT
SET INTRO CAPELLA COAXIAL (SET/KITS/TRAYS/PACK) ×2 IMPLANT
SHEATH BRITE TIP 6FRX11 (SHEATH) ×2 IMPLANT
SHEATH SHUTTLE SELECT 6F (SHEATH) ×2 IMPLANT
WIRE J 3MM .035X145CM (WIRE) ×2 IMPLANT

## 2016-07-01 NOTE — Progress Notes (Signed)
Pt clinically stable post angiogram, no carotid stent placed today as Dr Delana Meyer out to speak with patient regarding procedure, questions answered, to come back to office to discuss plan, to be discharged today, with information/instructions given, questions answered,vss, no active bleeding nor hematoma visible at site, denies complaints at this time.

## 2016-07-01 NOTE — Discharge Instructions (Signed)
Angiogram, Care After °Refer to this sheet in the next few weeks. These instructions provide you with information about caring for yourself after your procedure. Your health care provider may also give you more specific instructions. Your treatment has been planned according to current medical practices, but problems sometimes occur. Call your health care provider if you have any problems or questions after your procedure. °WHAT TO EXPECT AFTER THE PROCEDURE °After your procedure, it is typical to have the following: °· Bruising at the catheter insertion site that usually fades within 1-2 weeks. °· Blood collecting in the tissue (hematoma) that may be painful to the touch. It should usually decrease in size and tenderness within 1-2 weeks. °HOME CARE INSTRUCTIONS °· Take medicines only as directed by your health care provider. °· You may shower 24-48 hours after the procedure or as directed by your health care provider. Remove the bandage (dressing) and gently wash the site with plain soap and water. Pat the area dry with a clean towel. Do not rub the site, because this may cause bleeding. °· Do not take baths, swim, or use a hot tub until your health care provider approves. °· Check your insertion site every day for redness, swelling, or drainage. °· Do not apply powder or lotion to the site. °· Do not lift over 10 lb (4.5 kg) for 5 days after your procedure or as directed by your health care provider. °· Ask your health care provider when it is okay to: °¨ Return to work or school. °¨ Resume usual physical activities or sports. °¨ Resume sexual activity. °· Do not drive home if you are discharged the same day as the procedure. Have someone else drive you. °· You may drive 24 hours after the procedure unless otherwise instructed by your health care provider. °· Do not operate machinery or power tools for 24 hours after the procedure or as directed by your health care provider. °· If your procedure was done as an  outpatient procedure, which means that you went home the same day as your procedure, a responsible adult should be with you for the first 24 hours after you arrive home. °· Keep all follow-up visits as directed by your health care provider. This is important. °SEEK MEDICAL CARE IF: °· You have a fever. °· You have chills. °· You have increased bleeding from the catheter insertion site. Hold pressure on the site. °SEEK IMMEDIATE MEDICAL CARE IF: °· You have unusual pain at the catheter insertion site. °· You have redness, warmth, or swelling at the catheter insertion site. °· You have drainage (other than a small amount of blood on the dressing) from the catheter insertion site. °· The catheter insertion site is bleeding, and the bleeding does not stop after 30 minutes of holding steady pressure on the site. °· The area near or just beyond the catheter insertion site becomes pale, cool, tingly, or numb. °  °This information is not intended to replace advice given to you by your health care provider. Make sure you discuss any questions you have with your health care provider. °  °Document Released: 05/21/2005 Document Revised: 11/23/2014 Document Reviewed: 04/05/2013 °Elsevier Interactive Patient Education ©2016 [employer] ° °

## 2016-07-01 NOTE — Op Note (Signed)
[facility]   OPERATIVE NOTE  DATE: 07/01/2016  PRE-OPERATIVE DIAGNOSIS: 1. Critical recurrent left carotid artery stenosis  POST-OPERATIVE DIAGNOSIS: Same as above  PROCEDURE: 1.   Ultrasound Guidance for vascular access left femoral artery 2.   Catheter placement into the left common carotid artery from left femoral approach 3.   Arch aortogram 4.   Cervical and cerebral bilateral carotid angiograms 5.   Minx closure device right femoral artery  SURGEON: Christne Platts, Dolores Lory, MD  ASSISTANT(S): Lucky Cowboy, Erskine Squibb, M.D.  ANESTHESIA: Moderate conscious sedation  ESTIMATED BLOOD LOSS: Minimal  FLUORO TIME: 33.5 minutes  CONTRAST: 60 cc  MODERATE CONSCIOUS SEDATION TIME: Approximately 110 minutes using Versed and Fentanyl  FINDING(S): 1.  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 There is critical stenosis at the distal end of the left carotid patch site related to the previous carotid endarterectomy. Intracranial anatomy was normal.  SPECIMEN(S):  None  INDICATIONS:   Patient is a 70 y.o.female who presents with critical recurrence of the left carotid artery. The patient's renal function precluded CT angiogram. Catheter-based angiogram is performed for further evaluation. Risks and benefits are discussed and informed consent was obtained.  DESCRIPTION: After obtaining full informed written consent, the patient was brought back to the operating room and placed supine upon the vascular suite table.  After obtaining adequate anesthesia, the patient was prepped and draped in the standard fashion.  Moderate conscious sedation was administered during a face to face encounter with the patient throughout the procedure with my supervision of the RN administering medicines and monitoring the patients vital signs and mental status throughout from the start of the procedure until the patient was taken to the recovery room.  Initially the right common femoral artery was accessed however given her previous bypass surgeries and reconstructions the left side was necessary to move forward with the procedure and the micro-sheath was removed from the right and the left groin addressed.  The left common femoral artery was visualized with ultrasound and found to be calcific but patent. It was then accessed under direct ultrasound guidance without difficulty with a Seldinger needle. A J-wire and 5 French sheath were placed and a permanent image was recorded. The patient was given 7000 units of intravenous heparin. A pigtail catheter was placed into the ascending aorta and an LAO projection thoracic aortogram was performed. This showed a bovine arch no significant ostial lesions were noted.    I then selected multiple catheters and wires. This series of manipulations was the majority of the fluoroscopy time utilized. Ultimately the common left carotid was cannulated with a Simmons 1 catheter and wires were negotiated all the way to the external carotid . Cervical and cerebral carotid angiography were then performed on the left side initially. The intracranial flow was found to be normal. The cervical carotid artery was notable for marked tortuosity both in the common carotid as well as in the internal carotid artery previous endarterectomy is obvious given the confirmational changes with the patch. At the distal end of the patch a greater than 90% stenosis is confirmed.  This multiple attempts were made at advancing a catheter into the common carotid to the point where a stable platform could be placed unfortunately in spite of several different wires and catheters were never able to achieve stability that would allow for the sheath to be positioned properly given this moving forward with the planned carotid stenting is unacceptable and therefore  the procedure is terminated. At this time a full diagnostic angiography has been performed in  discussion with the patient regarding further possible avenues to reconstruction will be undertaken.  Minx device is placed in the left groin after oblique views obtained. There were no immediate complications.  COMPLICATIONS: None  CONDITION: Stable   Katha Cabal 07/01/2016 10:34 AM

## 2016-07-02 ENCOUNTER — Encounter: Payer: Self-pay | Admitting: Vascular Surgery

## 2016-07-14 ENCOUNTER — Other Ambulatory Visit: Payer: Self-pay | Admitting: Vascular Surgery

## 2016-07-28 ENCOUNTER — Other Ambulatory Visit
Admission: RE | Admit: 2016-07-28 | Discharge: 2016-07-28 | Disposition: A | Discharge status: Home / Self Care | Payer: Medicare Other | Source: Ambulatory Visit | Attending: Vascular Surgery | Admitting: Vascular Surgery

## 2016-07-28 DIAGNOSIS — Z01812 Encounter for preprocedural laboratory examination: Secondary | ICD-10-CM | POA: Insufficient documentation

## 2016-07-28 DIAGNOSIS — I10 Essential (primary) hypertension: Secondary | ICD-10-CM | POA: Insufficient documentation

## 2016-07-28 DIAGNOSIS — E785 Hyperlipidemia, unspecified: Secondary | ICD-10-CM | POA: Insufficient documentation

## 2016-07-28 DIAGNOSIS — Z951 Presence of aortocoronary bypass graft: Secondary | ICD-10-CM | POA: Insufficient documentation

## 2016-07-28 DIAGNOSIS — I2510 Atherosclerotic heart disease of native coronary artery without angina pectoris: Secondary | ICD-10-CM | POA: Insufficient documentation

## 2016-07-28 LAB — PROTIME-INR
INR: 0.89
Prothrombin Time: 12 seconds (ref 11.4–15.2)

## 2016-07-28 LAB — CBC WITH DIFFERENTIAL/PLATELET
Basophils Absolute: 0.1 10*3/uL (ref 0–0.1)
Basophils Relative: 1 %
Eosinophils Absolute: 0.3 10*3/uL (ref 0–0.7)
Eosinophils Relative: 7 %
HCT: 38.8 % (ref 35.0–47.0)
Hemoglobin: 13.1 g/dL (ref 12.0–16.0)
Lymphocytes Relative: 34 %
Lymphs Abs: 1.7 10*3/uL (ref 1.0–3.6)
MCH: 27.7 pg (ref 26.0–34.0)
MCHC: 33.8 g/dL (ref 32.0–36.0)
MCV: 82.2 fL (ref 80.0–100.0)
Monocytes Absolute: 0.5 10*3/uL (ref 0.2–0.9)
Monocytes Relative: 10 %
Neutro Abs: 2.5 10*3/uL (ref 1.4–6.5)
Neutrophils Relative %: 48 %
Platelets: 174 10*3/uL (ref 150–440)
RBC: 4.72 MIL/uL (ref 3.80–5.20)
RDW: 14.5 % (ref 11.5–14.5)
WBC: 5.1 10*3/uL (ref 3.6–11.0)

## 2016-07-28 LAB — APTT: aPTT: 30 seconds (ref 24–36)

## 2016-07-28 LAB — BASIC METABOLIC PANEL
Anion gap: 8 (ref 5–15)
BUN: 24 mg/dL — ABNORMAL HIGH (ref 6–20)
CO2: 26 mmol/L (ref 22–32)
Calcium: 9.7 mg/dL (ref 8.9–10.3)
Chloride: 104 mmol/L (ref 101–111)
Creatinine, Ser: 0.95 mg/dL (ref 0.44–1.00)
GFR calc Af Amer: >60 mL/min (ref >60)
GFR calc non Af Amer: 59 mL/min — ABNORMAL LOW (ref >60)
Glucose, Bld: 108 mg/dL — ABNORMAL HIGH (ref 65–99)
Potassium: 3.6 mmol/L (ref 3.5–5.1)
Sodium: 138 mmol/L (ref 135–145)

## 2016-07-29 ENCOUNTER — Ambulatory Visit: Payer: Medicare Other | Admitting: Anesthesiology

## 2016-07-29 ENCOUNTER — Inpatient Hospital Stay
Admission: RE | Admit: 2016-07-29 | Discharge: 2016-07-31 | DRG: 027 | Disposition: A | Discharge status: Home / Self Care | Payer: Medicare Other | Source: Ambulatory Visit | Attending: Vascular Surgery | Admitting: Vascular Surgery

## 2016-07-29 ENCOUNTER — Encounter: Payer: Self-pay | Admitting: *Deleted

## 2016-07-29 ENCOUNTER — Encounter
Admission: RE | Disposition: A | Discharge status: Home / Self Care | Payer: Self-pay | Source: Ambulatory Visit | Attending: Vascular Surgery

## 2016-07-29 DIAGNOSIS — E785 Hyperlipidemia, unspecified: Secondary | ICD-10-CM | POA: Diagnosis present

## 2016-07-29 DIAGNOSIS — K219 Gastro-esophageal reflux disease without esophagitis: Secondary | ICD-10-CM | POA: Diagnosis present

## 2016-07-29 DIAGNOSIS — Z79899 Other long term (current) drug therapy: Secondary | ICD-10-CM

## 2016-07-29 DIAGNOSIS — Z87891 Personal history of nicotine dependence: Secondary | ICD-10-CM

## 2016-07-29 DIAGNOSIS — Z8249 Family history of ischemic heart disease and other diseases of the circulatory system: Secondary | ICD-10-CM

## 2016-07-29 DIAGNOSIS — Z7952 Long term (current) use of systemic steroids: Secondary | ICD-10-CM

## 2016-07-29 DIAGNOSIS — M810 Age-related osteoporosis without current pathological fracture: Secondary | ICD-10-CM | POA: Diagnosis present

## 2016-07-29 DIAGNOSIS — I6522 Occlusion and stenosis of left carotid artery: Secondary | ICD-10-CM | POA: Diagnosis present

## 2016-07-29 DIAGNOSIS — I2510 Atherosclerotic heart disease of native coronary artery without angina pectoris: Secondary | ICD-10-CM | POA: Diagnosis present

## 2016-07-29 DIAGNOSIS — Z7982 Long term (current) use of aspirin: Secondary | ICD-10-CM | POA: Diagnosis not present

## 2016-07-29 DIAGNOSIS — I340 Nonrheumatic mitral (valve) insufficiency: Secondary | ICD-10-CM | POA: Diagnosis present

## 2016-07-29 DIAGNOSIS — Z9221 Personal history of antineoplastic chemotherapy: Secondary | ICD-10-CM | POA: Diagnosis not present

## 2016-07-29 DIAGNOSIS — Z792 Long term (current) use of antibiotics: Secondary | ICD-10-CM | POA: Diagnosis not present

## 2016-07-29 DIAGNOSIS — Z6835 Body mass index (BMI) 35.0-35.9, adult: Secondary | ICD-10-CM | POA: Diagnosis not present

## 2016-07-29 DIAGNOSIS — Z823 Family history of stroke: Secondary | ICD-10-CM

## 2016-07-29 DIAGNOSIS — Z8542 Personal history of malignant neoplasm of other parts of uterus: Secondary | ICD-10-CM

## 2016-07-29 DIAGNOSIS — Z23 Encounter for immunization: Secondary | ICD-10-CM | POA: Diagnosis not present

## 2016-07-29 DIAGNOSIS — I739 Peripheral vascular disease, unspecified: Secondary | ICD-10-CM | POA: Diagnosis present

## 2016-07-29 DIAGNOSIS — I10 Essential (primary) hypertension: Secondary | ICD-10-CM | POA: Diagnosis present

## 2016-07-29 DIAGNOSIS — I6529 Occlusion and stenosis of unspecified carotid artery: Secondary | ICD-10-CM | POA: Diagnosis present

## 2016-07-29 HISTORY — PX: PERIPHERAL VASCULAR CATHETERIZATION: SHX172C

## 2016-07-29 LAB — MRSA PCR SCREENING: MRSA by PCR: NEGATIVE

## 2016-07-29 LAB — ABO/RH: ABO/RH(D): O POS

## 2016-07-29 SURGERY — CAROTID PTA/STENT INTERVENTION
Anesthesia: General | Laterality: Left

## 2016-07-29 MED ORDER — NITROGLYCERIN 0.4 MG SL SUBL: 0.4000 mg | SUBLINGUAL_TABLET | SUBLINGUAL | Status: DC | PRN

## 2016-07-29 MED ORDER — CHLORHEXIDINE GLUCONATE CLOTH 2 % EX PADS
6.0000 | MEDICATED_PAD | Freq: Once | CUTANEOUS | Status: AC
  Administered 2016-07-29: 6 via TOPICAL

## 2016-07-29 MED ORDER — PNEUMOCOCCAL VAC POLYVALENT 25 MCG/0.5ML IJ INJ
0.5000 mL | INJECTION | INTRAMUSCULAR | Status: AC
  Administered 2016-07-30: 0.5 mL via INTRAMUSCULAR
  Filled 2016-07-29: qty 0.5

## 2016-07-29 MED ORDER — HYDRALAZINE HCL 20 MG/ML IJ SOLN: 5.0000 mg | INTRAMUSCULAR | Status: DC | PRN

## 2016-07-29 MED ORDER — SODIUM CHLORIDE 0.9 % IV SOLN
INTRAVENOUS | Status: DC
  Administered 2016-07-29: 07:00:00 via INTRAVENOUS

## 2016-07-29 MED ORDER — DOCUSATE SODIUM 100 MG PO CAPS
100.0000 mg | ORAL_CAPSULE | Freq: Every day | ORAL | Status: DC
  Administered 2016-07-30 – 2016-07-31 (×2): 100 mg via ORAL
  Filled 2016-07-29 (×2): qty 1

## 2016-07-29 MED ORDER — NITROGLYCERIN 0.2 MG/ML ON CALL CATH LAB
INTRAVENOUS | Status: DC | PRN
  Administered 2016-07-29: 10 ug via INTRAVENOUS
  Administered 2016-07-29: 20 ug via INTRAVENOUS

## 2016-07-29 MED ORDER — ONDANSETRON HCL 4 MG/2ML IJ SOLN: 4.0000 mg | Freq: Once | INTRAMUSCULAR | Status: DC | PRN

## 2016-07-29 MED ORDER — HEPARIN SODIUM (PORCINE) 1000 UNIT/ML IJ SOLN
INTRAMUSCULAR | Status: DC | PRN
Start: 2016-07-29 — End: 2016-07-29
  Administered 2016-07-29: 1000 [IU] via INTRAVENOUS
  Administered 2016-07-29: 3000 [IU] via INTRAVENOUS
  Administered 2016-07-29: 4000 [IU] via INTRAVENOUS

## 2016-07-29 MED ORDER — MIDAZOLAM HCL 2 MG/2ML IJ SOLN
INTRAMUSCULAR | Status: DC | PRN
  Administered 2016-07-29: 1 mg via INTRAVENOUS

## 2016-07-29 MED ORDER — PHENYLEPHRINE HCL 10 MG/ML IJ SOLN
INTRAMUSCULAR | Status: DC | PRN
  Administered 2016-07-29: 100 ug via INTRAVENOUS

## 2016-07-29 MED ORDER — NITROGLYCERIN IN D5W 200-5 MCG/ML-% IV SOLN: INTRAVENOUS | Status: DC | PRN

## 2016-07-29 MED ORDER — PANTOPRAZOLE SODIUM 40 MG PO TBEC
40.0000 mg | DELAYED_RELEASE_TABLET | Freq: Every day | ORAL | Status: DC
  Administered 2016-07-30 – 2016-07-31 (×2): 40 mg via ORAL
  Filled 2016-07-29 (×2): qty 1

## 2016-07-29 MED ORDER — METOPROLOL TARTRATE 5 MG/5ML IV SOLN
2.0000 mg | INTRAVENOUS | Status: DC | PRN
Start: 2016-07-29 — End: 2016-07-31

## 2016-07-29 MED ORDER — ROCURONIUM BROMIDE 100 MG/10ML IV SOLN
INTRAVENOUS | Status: DC | PRN
  Administered 2016-07-29: 20 mg via INTRAVENOUS
  Administered 2016-07-29: 30 mg via INTRAVENOUS

## 2016-07-29 MED ORDER — ATROPINE SULFATE 1 MG/ML IJ SOLN
INTRAMUSCULAR | Status: DC | PRN
  Administered 2016-07-29: 1 mg via INTRAVENOUS

## 2016-07-29 MED ORDER — SODIUM CHLORIDE 0.9 % IV SOLN
INTRAVENOUS | Status: DC | PRN
  Administered 2016-07-29: 08:00:00 via INTRAVENOUS

## 2016-07-29 MED ORDER — SODIUM CHLORIDE 0.9 % IV SOLN: 500.0000 mL | Freq: Once | INTRAVENOUS | Status: DC | PRN

## 2016-07-29 MED ORDER — IOPAMIDOL (ISOVUE-300) INJECTION 61%
INTRAVENOUS | Status: DC | PRN
  Administered 2016-07-29: 30 mL via INTRA_ARTERIAL

## 2016-07-29 MED ORDER — ROSUVASTATIN CALCIUM 20 MG PO TABS
20.0000 mg | ORAL_TABLET | Freq: Every day | ORAL | Status: DC
  Administered 2016-07-30 – 2016-07-31 (×2): 20 mg via ORAL
  Filled 2016-07-29 (×2): qty 1

## 2016-07-29 MED ORDER — MORPHINE SULFATE (PF) 2 MG/ML IV SOLN
2.0000 mg | INTRAVENOUS | Status: DC | PRN
  Administered 2016-07-29: 4 mg via INTRAVENOUS
  Administered 2016-07-30: 2 mg via INTRAVENOUS
  Administered 2016-07-30: 4 mg via INTRAVENOUS
  Filled 2016-07-29 (×2): qty 2
  Filled 2016-07-29: qty 1

## 2016-07-29 MED ORDER — DOPAMINE-DEXTROSE 3.2-5 MG/ML-% IV SOLN: 3.0000 ug/kg/min | INTRAVENOUS | Status: DC

## 2016-07-29 MED ORDER — ACETAMINOPHEN 650 MG RE SUPP: 325.0000 mg | RECTAL | Status: DC | PRN

## 2016-07-29 MED ORDER — ESTROGENS, CONJUGATED 0.625 MG/GM VA CREA
1.0000 | TOPICAL_CREAM | VAGINAL | Status: DC
  Filled 2016-07-29: qty 30

## 2016-07-29 MED ORDER — ALBUTEROL SULFATE HFA 108 (90 BASE) MCG/ACT IN AERS: 2.0000 | INHALATION_SPRAY | Freq: Four times a day (QID) | RESPIRATORY_TRACT | Status: DC | PRN

## 2016-07-29 MED ORDER — GUAIFENESIN-DM 100-10 MG/5ML PO SYRP: 15.0000 mL | ORAL_SOLUTION | ORAL | Status: DC | PRN

## 2016-07-29 MED ORDER — OXYCODONE-ACETAMINOPHEN 5-325 MG PO TABS
1.0000 | ORAL_TABLET | ORAL | Status: DC | PRN
  Administered 2016-07-29 – 2016-07-31 (×5): 2 via ORAL
  Filled 2016-07-29 (×5): qty 2

## 2016-07-29 MED ORDER — TICAGRELOR 90 MG PO TABS
180.0000 mg | ORAL_TABLET | Freq: Once | ORAL | Status: AC
  Administered 2016-07-29: 180 mg via ORAL
  Filled 2016-07-29: qty 2

## 2016-07-29 MED ORDER — ISOSORBIDE MONONITRATE ER 30 MG PO TB24
30.0000 mg | ORAL_TABLET | Freq: Every day | ORAL | Status: DC
  Administered 2016-07-30: 30 mg via ORAL
  Filled 2016-07-29 (×3): qty 1

## 2016-07-29 MED ORDER — PROPOFOL 10 MG/ML IV BOLUS
INTRAVENOUS | Status: DC | PRN
  Administered 2016-07-29: 40 mg via INTRAVENOUS
  Administered 2016-07-29: 100 mg via INTRAVENOUS
  Administered 2016-07-29: 20 mg via INTRAVENOUS

## 2016-07-29 MED ORDER — EPHEDRINE SULFATE 50 MG/ML IJ SOLN
INTRAMUSCULAR | Status: DC | PRN
  Administered 2016-07-29: 5 mg via INTRAVENOUS
  Administered 2016-07-29: 10 mg via INTRAVENOUS
  Administered 2016-07-29 (×2): 5 mg via INTRAVENOUS

## 2016-07-29 MED ORDER — PROPRANOLOL HCL 80 MG PO TABS
80.0000 mg | ORAL_TABLET | Freq: Every day | ORAL | Status: DC
  Administered 2016-07-30: 80 mg via ORAL
  Filled 2016-07-29 (×2): qty 1

## 2016-07-29 MED ORDER — ONDANSETRON HCL 4 MG/2ML IJ SOLN
INTRAMUSCULAR | Status: DC | PRN
  Administered 2016-07-29: 4 mg via INTRAVENOUS

## 2016-07-29 MED ORDER — LABETALOL HCL 5 MG/ML IV SOLN
10.0000 mg | INTRAVENOUS | Status: AC | PRN
  Administered 2016-07-29 – 2016-07-30 (×4): 10 mg via INTRAVENOUS
  Filled 2016-07-29 (×2): qty 4

## 2016-07-29 MED ORDER — DEXTROSE 5 % IV SOLN
1.5000 g | Freq: Two times a day (BID) | INTRAVENOUS | Status: AC
  Administered 2016-07-29 – 2016-07-30 (×2): 1.5 g via INTRAVENOUS
  Filled 2016-07-29 (×2): qty 1.5

## 2016-07-29 MED ORDER — SUGAMMADEX SODIUM 200 MG/2ML IV SOLN
INTRAVENOUS | Status: DC | PRN
  Administered 2016-07-29: 170 mg via INTRAVENOUS

## 2016-07-29 MED ORDER — FENTANYL CITRATE (PF) 100 MCG/2ML IJ SOLN
25.0000 ug | INTRAMUSCULAR | Status: DC | PRN
  Administered 2016-07-29 (×4): 25 ug via INTRAVENOUS

## 2016-07-29 MED ORDER — TORSEMIDE 20 MG PO TABS
20.0000 mg | ORAL_TABLET | Freq: Every day | ORAL | Status: DC
  Administered 2016-07-29 – 2016-07-30 (×2): 20 mg via ORAL
  Filled 2016-07-29 (×3): qty 1

## 2016-07-29 MED ORDER — ASPIRIN EC 81 MG PO TBEC: 81.0000 mg | DELAYED_RELEASE_TABLET | Freq: Every day | ORAL | Status: DC

## 2016-07-29 MED ORDER — CHLORHEXIDINE GLUCONATE CLOTH 2 % EX PADS: 6.0000 | MEDICATED_PAD | Freq: Once | CUTANEOUS | Status: DC

## 2016-07-29 MED ORDER — ASPIRIN EC 81 MG PO TBEC
81.0000 mg | DELAYED_RELEASE_TABLET | Freq: Every day | ORAL | Status: DC
  Administered 2016-07-29 – 2016-07-31 (×3): 81 mg via ORAL
  Filled 2016-07-29 (×3): qty 1

## 2016-07-29 MED ORDER — PHENOL 1.4 % MT LIQD
1.0000 | OROMUCOSAL | Status: DC | PRN
  Administered 2016-07-29: 1 via OROMUCOSAL
  Filled 2016-07-29 (×3): qty 177

## 2016-07-29 MED ORDER — TRAZODONE HCL 100 MG PO TABS
100.0000 mg | ORAL_TABLET | Freq: Every evening | ORAL | Status: DC | PRN
  Administered 2016-07-30 (×2): 100 mg via ORAL
  Filled 2016-07-29: qty 2
  Filled 2016-07-29 (×2): qty 1

## 2016-07-29 MED ORDER — CEFAZOLIN SODIUM-DEXTROSE 2-4 GM/100ML-% IV SOLN
2.0000 g | INTRAVENOUS | Status: AC
  Administered 2016-07-29: 2 g via INTRAVENOUS
  Filled 2016-07-29: qty 100

## 2016-07-29 MED ORDER — FUROSEMIDE 40 MG PO TABS: 40.0000 mg | ORAL_TABLET | Freq: Every day | ORAL | Status: DC

## 2016-07-29 MED ORDER — NITROGLYCERIN 0.2 MG/ML ON CALL CATH LAB: INTRAVENOUS | Status: DC | PRN

## 2016-07-29 MED ORDER — LIDOCAINE HCL (CARDIAC) 20 MG/ML IV SOLN
INTRAVENOUS | Status: DC | PRN
  Administered 2016-07-29: 50 mg via INTRAVENOUS

## 2016-07-29 MED ORDER — SPIRONOLACTONE 25 MG PO TABS
12.5000 mg | ORAL_TABLET | Freq: Every day | ORAL | Status: DC
  Administered 2016-07-29 – 2016-07-31 (×3): 12.5 mg via ORAL
  Filled 2016-07-29 (×3): qty 1

## 2016-07-29 MED ORDER — RISAQUAD PO CAPS
1.0000 | ORAL_CAPSULE | Freq: Every day | ORAL | Status: DC
  Administered 2016-07-30 – 2016-07-31 (×2): 1 via ORAL
  Filled 2016-07-29 (×2): qty 1

## 2016-07-29 MED ORDER — ALUM & MAG HYDROXIDE-SIMETH 200-200-20 MG/5ML PO SUSP: 15.0000 mL | ORAL | Status: DC | PRN

## 2016-07-29 MED ORDER — TICAGRELOR 60 MG PO TABS
60.0000 mg | ORAL_TABLET | Freq: Two times a day (BID) | ORAL | Status: DC
  Administered 2016-07-29 – 2016-07-31 (×4): 60 mg via ORAL
  Filled 2016-07-29 (×5): qty 1

## 2016-07-29 MED ORDER — ACETAMINOPHEN 325 MG PO TABS: 325.0000 mg | ORAL_TABLET | ORAL | Status: DC | PRN

## 2016-07-29 MED ORDER — HEPARIN SODIUM (PORCINE) 1000 UNIT/ML IJ SOLN
INTRAMUSCULAR | Status: AC
  Filled 2016-07-29: qty 1

## 2016-07-29 MED ORDER — TIOTROPIUM BROMIDE MONOHYDRATE 18 MCG IN CAPS: 18.0000 ug | ORAL_CAPSULE | Freq: Every day | RESPIRATORY_TRACT | Status: DC

## 2016-07-29 MED ORDER — KCL IN DEXTROSE-NACL 20-5-0.9 MEQ/L-%-% IV SOLN
INTRAVENOUS | Status: DC
  Administered 2016-07-29 – 2016-07-31 (×4): via INTRAVENOUS
  Filled 2016-07-29 (×6): qty 1000

## 2016-07-29 MED ORDER — FENTANYL CITRATE (PF) 100 MCG/2ML IJ SOLN
INTRAMUSCULAR | Status: DC | PRN
  Administered 2016-07-29 (×2): 50 ug via INTRAVENOUS
  Administered 2016-07-29 (×2): 25 ug via INTRAVENOUS
  Administered 2016-07-29: 50 ug via INTRAVENOUS

## 2016-07-29 MED ORDER — ALBUTEROL SULFATE (2.5 MG/3ML) 0.083% IN NEBU
2.5000 mg | INHALATION_SOLUTION | Freq: Four times a day (QID) | RESPIRATORY_TRACT | Status: DC | PRN
  Administered 2016-07-29: 2.5 mg via RESPIRATORY_TRACT
  Filled 2016-07-29: qty 3

## 2016-07-29 MED ORDER — ONDANSETRON HCL 4 MG/2ML IJ SOLN
4.0000 mg | Freq: Four times a day (QID) | INTRAMUSCULAR | Status: DC | PRN
  Filled 2016-07-29: qty 2

## 2016-07-29 SURGICAL SUPPLY — 21 items
BALLN VIATRAC 5X20X135 (BALLOONS) ×2
BALLN VTRAC 4.5X15X135 (BALLOONS) ×2
BALLOON VIATRAC 5X20X135 (BALLOONS) ×1 IMPLANT
BALLOON VTRAC 4.5X15X135 (BALLOONS) ×1 IMPLANT
COIL MREYE 3X4 (Vascular Products) ×2 IMPLANT
COIL MREYE 5X5 (Vascular Products) ×2 IMPLANT
DEVICE EMBOSHIELD NAV6 2.5-4.8 (WIRE) ×2 IMPLANT
DEVICE PRESTO INFLATION (MISCELLANEOUS) ×2 IMPLANT
DEVICE TORQUE (MISCELLANEOUS) ×4 IMPLANT
DRAPE INCISE IOBAN 66X45 STRL (DRAPES) ×2 IMPLANT
GLIDECATH ANGLED 4FR 120CM (CATHETERS) ×2 IMPLANT
GLIDEWIRE ANGLED SS 035X260CM (WIRE) ×2 IMPLANT
KIT CAROTID MANIFOLD (MISCELLANEOUS) ×2 IMPLANT
SET INTRO CAPELLA COAXIAL (SET/KITS/TRAYS/PACK) ×2 IMPLANT
SHEATH BRITE TIP 6FRX11 (SHEATH) ×2 IMPLANT
SHEATH SHUTTLE SELECT 6F (SHEATH) ×2 IMPLANT
SHIELD RADPAD SCOOP 12X17 (MISCELLANEOUS) ×2 IMPLANT
SPONGE XRAY 4X4 16PLY STRL (MISCELLANEOUS) ×4 IMPLANT
STENT XACT CAR 9-7X30X136 (Stent) ×2 IMPLANT
WIRE J 3MM .035X145CM (WIRE) ×2 IMPLANT
WIRE MAGIC TOR.035 180C (WIRE) ×2 IMPLANT

## 2016-07-29 NOTE — Anesthesia Preprocedure Evaluation (Addendum)
Anesthesia Evaluation  Patient identified by MRN, date of birth, ID band Patient awake    Reviewed: Allergy & Precautions, NPO status , Patient's Chart, lab work & pertinent test results  Airway Mallampati: II       Dental  (+) Upper Dentures, Lower Dentures   Pulmonary Recent URI , Resolved, former smoker,    breath sounds clear to auscultation       Cardiovascular hypertension, Pt. on medications + CAD, + Cardiac Stents and + Peripheral Vascular Disease   Rhythm:Regular Rate:Normal     Neuro/Psych Depression Previous Carotid Endarterectomy    GI/Hepatic Neg liver ROS, GERD  Medicated,  Endo/Other  negative endocrine ROS  Renal/GU negative Renal ROS     Musculoskeletal   Abdominal (+) + obese,   Peds  Hematology  (+) anemia ,   Anesthesia Other Findings   Reproductive/Obstetrics                            Anesthesia Physical Anesthesia Plan  ASA: III  Anesthesia Plan: General   Post-op Pain Management:    Induction: Intravenous  Airway Management Planned: Oral ETT  Additional Equipment: Arterial line  Intra-op Plan:   Post-operative Plan: Extubation in OR  Informed Consent: I have reviewed the patients History and Physical, chart, labs and discussed the procedure including the risks, benefits and alternatives for the proposed anesthesia with the patient or authorized representative who has indicated his/her understanding and acceptance.     Plan Discussed with: CRNA  Anesthesia Plan Comments:         Anesthesia Quick Evaluation

## 2016-07-29 NOTE — Op Note (Signed)
OPERATIVE NOTE DATE: 07/29/2016  PROCEDURE: 1.  Cut down left neck for access to the common carotid artery 2.  Placement of a 9 x 7 x 30 Exact stent with the use of the NAV-6 embolic protection device in the left internal carotid artery 3.  Introduction catheter into the left external carotid artery second order catheter placement 4.  Coil embolization distal branches of the left external carotid artery  PRE-OPERATIVE DIAGNOSIS: 1. 95% recurrent left internal carotid artery stenosis.   POST-OPERATIVE DIAGNOSIS:  1. Symptomatic 95% recurrent left internal carotid artery stenosis.                                                           2.  Extravasation distal branch left external carotid artery  SURGEON: Hortencia Pilar, M.D.  ASSISTANT(S):  Leotis Pain, MD  ANESTHESIA: local/MCS  ESTIMATED BLOOD LOSS:  100 cc  CONTRAST: 30 cc  FLUORO TIME: 9.5 minutes  MODERATE CONSCIOUS SEDATION TIME:  General anesthesia  FINDING(S): 1.   95% recurrent left internal carotid artery stenosis  SPECIMEN(S):   none  INDICATIONS:   Patient is a 70 year old woman who presents with  symptomatic recurrent left internal carotid artery stenosis.  The patient has a very high lesion which is poorly accessible to surgery as well as a history of previous carotid endarterectomy and carotid artery stenting was felt to be preferred to endarterectomy for that reason.  Risks and benefits were discussed and informed consent was obtained.   DESCRIPTION: After obtaining full informed written consent, the patient was brought back to the vascular suite and placed supine upon the table.  The patient received IV antibiotics prior to induction.  After obtaining adequate general anesthesia, the patient was prepped and draped in the standard fashion.  Appropriate timeout is called. Linear incision is then created through the previous incisional scar along the inferior one third of the scar. Dissection was carried  down through the soft tissues transecting the platysma and identifying the sternocleidomastoid muscle which is then reflected laterally. Carotid impulse is then appreciated in the common carotid artery is identified. It is then dissected circumferentially reflecting the internal jugular vein laterally. Vessel loop was then placed. A microneedle is then inserted through the skin approximate 1 cm below the level of the clavicle and at a gentle angle the microneedle is inserted into the common carotid artery. Microwire is then advanced under fluoroscopic guidance and subsequently a micro-sheath was inserted. Hand injection of contrast is then performed to identify the internal and external carotid arteries and a stiff angle glide wires advanced through the micro-sheath up into the external carotid artery. Small incision is made at the wire insertion site and a 90 cm 6 [place] shuttle sheath is then advanced under fluoroscopic guidance over the wire and positioned with the tip of the sheath 1 cm below the bifurcation.  The patient was then heparinized and a total of 8000 units of intravenous heparin were given and an ACT was checked to confirm successful anticoagulation. Hand injection contrast was then performed to demonstrate the carotid artery bifurcation and the lesion. This showed greater than 95% stenosis of the internal carotid artery very high up above the previous patch.   Hand injection of contrast in preparation for accessing the internal carotid artery demonstrated extravasation  in one of the distalmost branches of the external. Given her anticoagulated state it was elected to coil this area to prevent postoperative complications. A 4 French angled glide catheter and a Glidewire were then negotiated into the external advanced near the extravasation. 2 coils a 5 x 4 and a 4 x 3 were then placed without difficulty. Follow-up imaging through the catheter demonstrated cessation of flow no further  extravasation noted. Attention was then turned to stenting of the carotid lesion.  I then negotiated the NAV-6  Embolic protection device and crossed the lesion and parked this in the distal internal carotid artery at the base of the skull.  I then selected a 9 x 7 x 30 Exact stent. This was deployed across the lesion encompassing it in its entirety. A 5 x 2 length balloon was used to post dilate the stent. Only about a 15 % residual stenosis was present after angioplasty. Completion angiogram showed normal intracranial filling without new defects. At this point I elected to terminate the procedure. The sheath was removed and the carotid artery was repaired with a single interrupted 6-0 Prolene suture. The wound was then irrigated copiously inspected for hemostasis Surgicel was placed and the wound was closed in layers using 3-0 Vicryl followed by 4-0 Monocryl subcuticular and then a honeycomb dressing.   The patient was taken to the recovery room in stable condition having tolerated the procedure well moving all extremities and obeying simple commands.  COMPLICATIONS: none  CONDITION: stable  Katha Cabal 07/29/2016 11:44 AM

## 2016-07-29 NOTE — H&P (Signed)
[facility] History & Physical Update  The patient was interviewed and re-examined.  The patient's previous History and Physical has been reviewed and is unchanged.  There is no change in the plan of care. We plan to proceed with the scheduled procedure.  Schnier, Dolores Lory, MD  07/29/2016, 7:34 AM

## 2016-07-29 NOTE — Anesthesia Procedure Notes (Signed)
Procedure Name: Intubation Date/Time: 07/29/2016 7:59 AM Performed by: Johnna Acosta Pre-anesthesia Checklist: Patient identified, Emergency Drugs available, Suction available, Patient being monitored and Timeout performed Patient Re-evaluated:Patient Re-evaluated prior to inductionOxygen Delivery Method: Circle system utilized Preoxygenation: Pre-oxygenation with 100% oxygen Intubation Type: IV induction Ventilation: Oral airway inserted - appropriate to patient size and Mask ventilation without difficulty Laryngoscope Size: Miller and 2 Grade View: Grade I Tube type: Oral Tube size: 7.0 mm Number of attempts: 1 Airway Equipment and Method: Stylet Placement Confirmation: ETT inserted through vocal cords under direct vision,  positive ETCO2 and breath sounds checked- equal and bilateral Secured at: 21 cm Tube secured with: Tape Dental Injury: Teeth and Oropharynx as per pre-operative assessment

## 2016-07-29 NOTE — Anesthesia Postprocedure Evaluation (Signed)
Anesthesia Post Note  Patient: Alyssa Murillo  Procedure(s) Performed: Procedure(s) (LRB): Carotid PTA/Stent Intervention (Left)  Patient location during evaluation: PACU Anesthesia Type: General Level of consciousness: awake Pain management: satisfactory to patient Vital Signs Assessment: post-procedure vital signs reviewed and stable Respiratory status: nonlabored ventilation Cardiovascular status: stable Anesthetic complications: no    Last Vitals:  Vitals:   07/29/16 1122 07/29/16 1132  BP: (!) 145/75 (!) 151/90  Pulse: 85 89  Resp: (!) 8 14  Temp:      Last Pain:  Vitals:   07/29/16 1132  PainSc: Asleep                 VAN STAVEREN,Hilde Churchman

## 2016-07-29 NOTE — Progress Notes (Signed)
As noted in documentation BP obtained from the automatic cuff was reading much lower compared to the BP obtained from arterial line. Paged MD Schnier and was called back and wanted clarification on which pressure to go by when administering PRN antihypertensives. MD Schneir stated to obtain manual blood pressure and to follow the pressure it was more closely matched with. Manual blood pressure obtained, see documentation. PRN antihypertensives administered per order parameters. Will continue to monitor patient.

## 2016-07-29 NOTE — Progress Notes (Signed)
Paged MD Schnier to inform of swollen soft area on roof of patients mouth which is causing much discomfort. I have ordered chloraseptic spray and allowed patient to have ice chips to help with swelling, neither option has been successful. MD Schneir said continue with both interventions and there wasn't anything else to try. Will continue to monitor patient.

## 2016-07-29 NOTE — Transfer of Care (Signed)
Immediate Anesthesia Transfer of Care Note  Patient: Alyssa Murillo  Procedure(s) Performed: Procedure(s): Carotid PTA/Stent Intervention (Left)  Patient Location: PACU  Anesthesia Type:General  Level of Consciousness: awake and sedated  Airway & Oxygen Therapy: Patient Spontanous Breathing and Patient connected to face mask oxygen  Post-op Assessment: Report given to RN and Post -op Vital signs reviewed and stable  Post vital signs: Reviewed and stable  Last Vitals:  Vitals:   07/29/16 0645 07/29/16 1042  BP: (!) 173/84 (!) 147/84  Pulse: 69 91  Resp: 16 10  Temp: 36.7 C 36.2 C    Last Pain:  Vitals:   07/29/16 0645  PainSc: 5          Complications: No apparent anesthesia complications

## 2016-07-29 NOTE — Progress Notes (Signed)
Patient arrived to ICU complaining of pain. After releasing and reviewing orders for surgeon it was noted that there were no active orders for pain relief. I paged MD Schnier and was called back by MD and made Schneir aware that there were no active orders for pain medication. See new orders. Will continue to monitor patient.

## 2016-07-30 ENCOUNTER — Encounter: Payer: Self-pay | Admitting: Vascular Surgery

## 2016-07-30 LAB — BASIC METABOLIC PANEL
Anion gap: 7 (ref 5–15)
BUN: 17 mg/dL (ref 6–20)
CO2: 25 mmol/L (ref 22–32)
Calcium: 8.1 mg/dL — ABNORMAL LOW (ref 8.9–10.3)
Chloride: 109 mmol/L (ref 101–111)
Creatinine, Ser: 0.79 mg/dL (ref 0.44–1.00)
GFR calc Af Amer: >60 mL/min (ref >60)
GFR calc non Af Amer: >60 mL/min (ref >60)
Glucose, Bld: 118 mg/dL — ABNORMAL HIGH (ref 65–99)
Potassium: 3.5 mmol/L (ref 3.5–5.1)
Sodium: 141 mmol/L (ref 135–145)

## 2016-07-30 LAB — TYPE AND SCREEN
ABO/RH(D): O POS
Antibody Screen: NEGATIVE
Unit division: 0
Unit division: 0

## 2016-07-30 LAB — CBC
HCT: 35.1 % (ref 35.0–47.0)
Hemoglobin: 11.8 g/dL — ABNORMAL LOW (ref 12.0–16.0)
MCH: 28.1 pg (ref 26.0–34.0)
MCHC: 33.7 g/dL (ref 32.0–36.0)
MCV: 83.5 fL (ref 80.0–100.0)
Platelets: 139 10*3/uL — ABNORMAL LOW (ref 150–440)
RBC: 4.21 MIL/uL (ref 3.80–5.20)
RDW: 14.9 % — ABNORMAL HIGH (ref 11.5–14.5)
WBC: 8.8 10*3/uL (ref 3.6–11.0)

## 2016-07-30 LAB — PREPARE RBC (CROSSMATCH)

## 2016-07-30 NOTE — Progress Notes (Signed)
[facility]  Daily Progress Note   Subjective  - 1 Day Post-Op  Patient has been c/o pain in the throat and the feeling like she was choking  She is unwilling to go home  Objective Vitals:   07/30/16 0500 07/30/16 0600 07/30/16 0700 07/30/16 0800  BP: (!) 126/57 137/67 (!) 142/70 (!) 147/80  Pulse: 64 75 83 87  Resp: (!) 9 12 15 17   Temp:      TempSrc:      SpO2: 97% 100% 100% 98%  Weight:      Height:        Intake/Output Summary (Last 24 hours) at 07/30/16 0931 Last data filed at 07/30/16 0700  Gross per 24 hour  Intake           1972.5 ml  Output             3560 ml  Net          -1587.5 ml    PULM  Normal effort , no use of accessory muscles CV  No JVD, RRR Abd      No distended, nontender VASC  Neck incision is intact small hematoma NEURO:  intact  Laboratory CBC    Component Value Date/Time   WBC 8.8 07/30/2016 0603   HGB 11.8 (L) 07/30/2016 0603   HGB 13.2 06/18/2014 1215   HCT 35.1 07/30/2016 0603   HCT 41.1 06/18/2014 1215   PLT 139 (L) 07/30/2016 0603   PLT 256 06/18/2014 1215    BMET    Component Value Date/Time   NA 141 07/30/2016 0603   NA 136 06/11/2013 1632   K 3.5 07/30/2016 0603   K 4.0 06/18/2014 1215   CL 109 07/30/2016 0603   CL 103 06/11/2013 1632   CO2 25 07/30/2016 0603   CO2 27 06/11/2013 1632   GLUCOSE 118 (H) 07/30/2016 0603   GLUCOSE 107 (H) 06/11/2013 1632   BUN 17 07/30/2016 0603   BUN 27 (H) 06/11/2013 1632   CREATININE 0.79 07/30/2016 0603   CREATININE 1.20 06/11/2013 1632   CALCIUM 8.1 (L) 07/30/2016 0603   CALCIUM 9.6 06/11/2013 1632   GFRNONAA >60 07/30/2016 0603   GFRNONAA 47 (L) 06/11/2013 1632   GFRAA >60 07/30/2016 0603   GFRAA 54 (L) 06/11/2013 1632    Assessment/Planning: POD #1s/p open angioplasty of the left ICA   I will make her step down and give her only clear liquids    Katha Cabal  07/30/2016, 9:31 AM

## 2016-07-30 NOTE — Care Management Note (Signed)
Case Management Note  Patient Details  Name: Alyssa Murillo MRN: [id number] Date of Birth: July 06, 1946  Subjective/Objective:                  Met with patient to discuss discharge planning. Explained my role at [facility].  Patient is from home alone where she plans to return to with the assistance of her son. She states she is independent with ambulation. She is currently complaining of "swelling under her chin in the neck area and pain in her mouth". The nurse has addressed this with MD. Her PCP is with Dr. Ouida Sills at [facility]. She denies any problems with obtaining medications or getting to and from appointments. She denies RNCM needs.   Action/Plan: My contact card was shared with patient in the event she needs CM assistance.  Expected Discharge Date:                  Expected Discharge Plan:     In-House Referral:     Discharge planning Services     Post Acute Care Choice:    Choice offered to:  Patient  DME Arranged:    DME Agency:     HH Arranged:    [place] Agency:     Status of Service:  Completed, signed off  If discussed at [employer] of Stay Meetings, dates discussed:    Additional Comments:  Marshell Garfinkel, RN 07/30/2016, 12:05 PM

## 2016-07-31 LAB — GLUCOSE, CAPILLARY: Glucose-Capillary: 103 mg/dL — ABNORMAL HIGH (ref 65–99)

## 2016-07-31 MED ORDER — TICAGRELOR 60 MG PO TABS: 60.0000 mg | ORAL_TABLET | Freq: Two times a day (BID) | ORAL | Status: DC

## 2016-07-31 MED ORDER — OXYCODONE-ACETAMINOPHEN 5-325 MG PO TABS: 1.0000 | ORAL_TABLET | Freq: Four times a day (QID) | ORAL | 0 refills | Status: DC | PRN

## 2016-07-31 NOTE — Progress Notes (Signed)
[facility]  Daily Progress Note  Subjective: 2 Days Post-Op: Cut down left neck for access to the common carotid artery, Placement of a 9 x 7 x 30 Exact stent with the use of the NAV-6 embolic protection device in the left internal carotid artery,  Introduction catheter into the left external carotid artery second order catheter placement with Coil embolization distal branches of the left external carotid artery.  Patient doing better today. Without complaint. Asking for discharge home.   Objective: Vitals:   07/30/16 2100 07/31/16 0344 07/31/16 0901 07/31/16 1057  BP:  (!) 154/53 (!) 109/56 (!) 114/51  Pulse:  79 77 67  Resp: 18 20    Temp: 98 F (36.7 C) 98.7 F (37.1 C)    TempSrc: Oral Oral    SpO2: 97% 93%    Weight:      Height:        Intake/Output Summary (Last 24 hours) at 07/31/16 1145 Last data filed at 07/31/16 0914  Gross per 24 hour  Intake          4285.81 ml  Output              750 ml  Net          3535.81 ml   Physical Exam: A&Ox3, NAD Face: No facial droop, tongue symmetrical Neck: Mild swelling, some ecchymosis noted, two small blisters from tape adhesive CV: RRR Pulmonary: CTA Bilaterally Abdomen: Soft, Nontender, Nondistended Vascular: extremities warm, non-tender, minimal edema Neuro: Motor and sensation intact. Strength equal upper / lower   Laboratory: CBC    Component Value Date/Time   WBC 8.8 07/30/2016 0603   HGB 11.8 (L) 07/30/2016 0603   HGB 13.2 06/18/2014 1215   HCT 35.1 07/30/2016 0603   HCT 41.1 06/18/2014 1215   PLT 139 (L) 07/30/2016 0603   PLT 256 06/18/2014 1215   BMET    Component Value Date/Time   NA 141 07/30/2016 0603   NA 136 06/11/2013 1632   K 3.5 07/30/2016 0603   K 4.0 06/18/2014 1215   CL 109 07/30/2016 0603   CL 103 06/11/2013 1632   CO2 25 07/30/2016 0603   CO2 27 06/11/2013 1632   GLUCOSE 118 (H) 07/30/2016 0603   GLUCOSE 107 (H) 06/11/2013 1632   BUN 17 07/30/2016 0603   BUN 27  (H) 06/11/2013 1632   CREATININE 0.79 07/30/2016 0603   CREATININE 1.20 06/11/2013 1632   CALCIUM 8.1 (L) 07/30/2016 0603   CALCIUM 9.6 06/11/2013 1632   GFRNONAA >60 07/30/2016 0603   GFRNONAA 47 (L) 06/11/2013 1632   GFRAA >60 07/30/2016 0603   GFRAA 54 (L) 06/11/2013 1632   Assessment/Planning: 70 year old female s/p 2 Days Post-Op: Cut down left neck for access to the common carotid artery, Placement of a 9 x 7 x 30 Exact stent with the use of the NAV-6 embolic protection device in the left internal carotid artery,  Introduction catheter into the left external carotid artery second order catheter placement with Coil embolization distal branches of the left external carotid artery - stable 1) OK to be discharged home  [facility] PA-C 07/31/2016 11:45 AM

## 2016-07-31 NOTE — Discharge Instructions (Signed)
You may shower as of tomorrow. Keep incision clean and dry. No driving while on pain medication. No driving until you follow up in clinic.

## 2016-07-31 NOTE — Discharge Summary (Signed)
[facility]    Discharge Summary    Patient ID:  Alyssa Murillo MRN: [id number] DOB/AGE: 03/08/1946 70 y.o.  Admit date: 07/29/2016 Discharge date: 07/31/2016 Date of Surgery: 07/29/2016 Surgeon: Surgeon(s): Katha Cabal, MD Algernon Huxley, MD  Admission Diagnosis: Hybrid Left Carotid Stent  Mickel Baas spoke with [name]   OR Anesthesia needed Dr Lucky Cowboy to Assist  Discharge Diagnoses:  Hybrid Left Carotid Stent  Mickel Baas spoke with [name]   OR Anesthesia needed Dr Lucky Cowboy to Assist  Secondary Diagnoses: Past Medical History:  Diagnosis Date  . Anemia   . Brachial vein thrombus, right ([place]) 2005  . CAD (coronary artery disease)   . Carotid artery plaque 07/2012, 02/2013   removal of carotid artery plaque...bilaterally  . Colon polyps   . Depression   . Endometrial cancer ([place]) 09/2007  . Esophagitis   . Gastritis   . GERD (gastroesophageal reflux disease)   . H/O colonoscopy 2014  . H/O mammogram 2015  . History of brachytherapy 01/2008   finished 2009  . History of chemotherapy    carboplatin, paclitaxel x 4 cycles  . Hyperlipidemia   . Hypertension   . Left ventricular hypertrophy   . Mitral valve regurgitation   . Osteoporosis    tx with evista started  . PAD (peripheral artery disease) ([place])   . PVD (peripheral vascular disease) ([place])   . Stented coronary artery 2004  . Tricuspid regurgitation    Procedure(s): Carotid PTA/Stent Intervention  Discharged Condition: good  HPI:  Alyssa Murillo is a 70 year old female s/p 2 Days Post-Op: Cut down left neck for access to the common carotid artery, Placement of a 9 x 7 x 30Exact stent with the use of the NAV-6 embolic protection device in the left internalcarotid artery, Introduction catheter into the left external carotid artery second order catheter placement with Coil embolization distal branches of the left external carotid artery for symptomatic 95% recurrent left internal carotid artery   Stenosis with Extravasation distal branch left external carotid artery. Patient tolerated the procedure well and was transferred to the ICU for overnight observation without issue. Her night of surgery was unremarkable.   Patient refused to leave POD #1 and was kept for further observation. By afternoon of POD#1 the patient was tolerating a regular diet, ambulating independently, urinating without issue and her pain was controlled with PO pain medication.   Hospital Course:  Alyssa Murillo is a 70 y.o. female is S/P Left  Procedure(s): Carotid PTA/Stent Intervention  Extubated: POD # 0  Physical exam:  A&Ox3, NAD Face: No facial droop, tongue symmetrical Neck: Mild swelling, some ecchymosis noted, two small blisters from tape adhesive CV: RRR Pulmonary: CTA Bilaterally Abdomen: Soft, Nontender, Nondistended Vascular: extremities warm, non-tender, minimal edema Neuro: Motor and sensation intact. Strength equal upper / lower  Post-op wounds clean, dry, intact or healing well  Pt. Ambulating, voiding and taking PO diet without difficulty.  Pt pain controlled with PO pain meds.  Labs as below  Complications:none  Consults:  None  Significant Diagnostic Studies: CBC Lab Results  Component Value Date   WBC 8.8 07/30/2016   HGB 11.8 (L) 07/30/2016   HCT 35.1 07/30/2016   MCV 83.5 07/30/2016   PLT 139 (L) 07/30/2016   BMET    Component Value Date/Time   NA 141 07/30/2016 0603   NA 136 06/11/2013 1632   K 3.5 07/30/2016 0603   K 4.0 06/18/2014 1215   CL 109  07/30/2016 0603   CL 103 06/11/2013 1632   CO2 25 07/30/2016 0603   CO2 27 06/11/2013 1632   GLUCOSE 118 (H) 07/30/2016 0603   GLUCOSE 107 (H) 06/11/2013 1632   BUN 17 07/30/2016 0603   BUN 27 (H) 06/11/2013 1632   CREATININE 0.79 07/30/2016 0603   CREATININE 1.20 06/11/2013 1632   CALCIUM 8.1 (L) 07/30/2016 0603   CALCIUM 9.6 06/11/2013 1632   GFRNONAA >60 07/30/2016 0603   GFRNONAA 47 (L) 06/11/2013  1632   GFRAA >60 07/30/2016 0603   GFRAA 54 (L) 06/11/2013 1632   COAG Lab Results  Component Value Date   INR 0.89 07/28/2016   INR 1.1 02/25/2013   INR 1.0 07/21/2012   Disposition:  Discharge to :Home    Medication List    STOP taking these medications   HYDROcodone-acetaminophen 5-325 MG tablet Commonly known as:  NORCO     TAKE these medications   aspirin 325 MG tablet Take 1 tablet by mouth daily.   colchicine-probenecid 0.5-500 MG tablet Take 1 tablet by mouth 2 (two) times daily as needed (gout).   conjugated estrogens vaginal cream Commonly known as:  PREMARIN Place 1 Applicatorful vaginally 3 (three) times a week.   furosemide 40 MG tablet Commonly known as:  LASIX Take 1 tablet by mouth daily.   gabapentin 100 MG capsule Commonly known as:  NEURONTIN Take 300 capsules by mouth 3 (three) times daily.   isosorbide mononitrate 30 MG 24 hr tablet Commonly known as:  IMDUR Take 1 tablet by mouth daily. In am   NEXIUM 40 MG capsule Generic drug:  esomeprazole 1 tablet daily.   nitroGLYCERIN 0.4 MG SL tablet Commonly known as:  NITROSTAT Place 0.4 mg under the tongue every 5 (five) minutes as needed for chest pain.   oxyCODONE-acetaminophen 5-325 MG tablet Commonly known as:  PERCOCET/ROXICET Take 1-2 tablets by mouth every 6 (six) hours as needed for moderate pain or severe pain.   PROAIR HFA 108 (90 Base) MCG/ACT inhaler Generic drug:  albuterol Inhale 2 puffs into the lungs every 6 (six) hours as needed for wheezing.   PROBIOTIC ADVANCED PO Take 1 tablet by mouth 3 (three) times daily.   propranolol 80 MG tablet Commonly known as:  INDERAL Take 1 tablet by mouth daily. In am   rosuvastatin 20 MG tablet Commonly known as:  CRESTOR Take 1 tablet by mouth daily. In am   SPIRIVA HANDIHALER 18 MCG inhalation capsule Generic drug:  tiotropium Place 18 mcg into inhaler and inhale daily. In am   spironolactone 25 MG tablet Commonly known as:   ALDACTONE Take 0.5 tablets by mouth daily. In am   ticagrelor 60 MG Tabs tablet Commonly known as:  BRILINTA Take 1 tablet (60 mg total) by mouth 2 (two) times daily.   torsemide 20 MG tablet Commonly known as:  DEMADEX Take 20 mg by mouth daily. In the morning   traZODone 100 MG tablet Commonly known as:  DESYREL Take 1-2 tablets by mouth at bedtime as needed for sleep.      Written script for percocet and brilinta in chart.   Verbal and written Discharge instructions given to the patient. Wound care per Discharge AVS Follow-up Information    Schnier, Dolores Lory, MD Follow up in 1 week(s).   Specialties:  Vascular Surgery, Cardiology, Radiology, Vascular Surgery Why:  Postop Check Contact information: [place] [address] [phone]          Signed:  [place], PA-C  07/31/2016, 12:01 PM

## 2016-07-31 NOTE — Progress Notes (Signed)
07/31/2016  BP 106/61   Pulse 65   Temp 98 F (36.7 C) (Oral)   Resp 20   Ht 5' (1.524 m)   Wt 81.6 kg (180 lb)   SpO2 99%   BMI 35.15 kg/m  Patient discharged per MD orders. Discharge instructions reviewed with patient and patient verbalized understanding. IV removed per policy. Prescriptions discussed and given to patient. Discharged via wheelchair escorted by nursing staff.  Almedia Balls, RN

## 2016-08-18 ENCOUNTER — Other Ambulatory Visit: Payer: Self-pay | Admitting: Internal Medicine

## 2016-08-18 DIAGNOSIS — Z1231 Encounter for screening mammogram for malignant neoplasm of breast: Secondary | ICD-10-CM

## 2016-09-08 ENCOUNTER — Ambulatory Visit
Admission: RE | Admit: 2016-09-08 | Discharge: 2016-09-08 | Disposition: A | Discharge status: Home / Self Care | Payer: Medicare Other | Source: Ambulatory Visit | Attending: Internal Medicine | Admitting: Internal Medicine

## 2016-09-08 DIAGNOSIS — Z1231 Encounter for screening mammogram for malignant neoplasm of breast: Secondary | ICD-10-CM | POA: Insufficient documentation

## 2016-09-15 ENCOUNTER — Other Ambulatory Visit: Payer: Self-pay | Admitting: *Deleted

## 2016-09-15 MED ORDER — ESTROGENS, CONJUGATED 0.625 MG/GM VA CREA: 1.0000 | TOPICAL_CREAM | VAGINAL | 2 refills | Status: DC

## 2016-11-04 ENCOUNTER — Other Ambulatory Visit (INDEPENDENT_AMBULATORY_CARE_PROVIDER_SITE_OTHER): Payer: Self-pay | Admitting: Vascular Surgery

## 2016-11-04 DIAGNOSIS — I6523 Occlusion and stenosis of bilateral carotid arteries: Secondary | ICD-10-CM

## 2016-11-05 ENCOUNTER — Ambulatory Visit (INDEPENDENT_AMBULATORY_CARE_PROVIDER_SITE_OTHER): Payer: Medicare Other

## 2016-11-05 ENCOUNTER — Encounter (INDEPENDENT_AMBULATORY_CARE_PROVIDER_SITE_OTHER): Payer: Self-pay | Admitting: Vascular Surgery

## 2016-11-05 ENCOUNTER — Ambulatory Visit (INDEPENDENT_AMBULATORY_CARE_PROVIDER_SITE_OTHER): Payer: Medicare Other | Admitting: Vascular Surgery

## 2016-11-05 VITALS — BP 129/80 | HR 65 | Resp 17 | Ht 60.0 in | Wt 185.0 lb

## 2016-11-05 DIAGNOSIS — I2510 Atherosclerotic heart disease of native coronary artery without angina pectoris: Secondary | ICD-10-CM | POA: Insufficient documentation

## 2016-11-05 DIAGNOSIS — I25118 Atherosclerotic heart disease of native coronary artery with other forms of angina pectoris: Secondary | ICD-10-CM | POA: Diagnosis not present

## 2016-11-05 DIAGNOSIS — I10 Essential (primary) hypertension: Secondary | ICD-10-CM

## 2016-11-05 DIAGNOSIS — I6522 Occlusion and stenosis of left carotid artery: Secondary | ICD-10-CM | POA: Diagnosis not present

## 2016-11-05 DIAGNOSIS — E782 Mixed hyperlipidemia: Secondary | ICD-10-CM | POA: Diagnosis not present

## 2016-11-05 DIAGNOSIS — E785 Hyperlipidemia, unspecified: Secondary | ICD-10-CM | POA: Insufficient documentation

## 2016-11-05 DIAGNOSIS — I6523 Occlusion and stenosis of bilateral carotid arteries: Secondary | ICD-10-CM

## 2016-11-05 NOTE — Progress Notes (Signed)
MRN : [id number]  Alyssa Murillo is a 70 y.o. (1946-01-12) female who presents with chief complaint of  Chief Complaint  Patient presents with  . Follow-up  .  History of Present Illness: The patient is seen for follow up evaluation of carotid stenosis. She is s/p left carotid stenting for recurrent stenosis after previous CEA.  The procedure was without complication on [id number]. The carotid stenosis followed by ultrasound.   The patient denies amaurosis fugax. There is no recent history of TIA symptoms or focal motor deficits. There is no prior documented CVA.  The patient is taking enteric-coated aspirin 81 mg daily.  There is no history of migraine headaches. There is no history of seizures.  The patient has a history of coronary artery disease, no recent episodes of angina or shortness of breath. The patient denies PAD or claudication symptoms. There is a history of hyperlipidemia which is being treated with a statin.    Carotid Duplex done today shows widely patent LICA stent.    Current Meds  Medication Sig  . albuterol (PROAIR HFA) 108 (90 BASE) MCG/ACT inhaler Inhale 2 puffs into the lungs every 6 (six) hours as needed for wheezing.   Marland Kitchen aspirin 325 MG tablet Take 1 tablet by mouth daily.  Marland Kitchen conjugated estrogens (PREMARIN) vaginal cream Place 1 Applicatorful vaginally 3 (three) times a week.  . esomeprazole (NEXIUM) 40 MG capsule 1 tablet daily.  . furosemide (LASIX) 40 MG tablet Take 1 tablet by mouth daily.  . isosorbide mononitrate (IMDUR) 30 MG 24 hr tablet Take 1 tablet by mouth daily. In am  . oxyCODONE-acetaminophen (PERCOCET/ROXICET) 5-325 MG tablet Take 1-2 tablets by mouth every 6 (six) hours as needed for moderate pain or severe pain.  . Probiotic Product (PROBIOTIC ADVANCED PO) Take 1 tablet by mouth 3 (three) times daily.  . propranolol (INDERAL) 80 MG tablet Take 1 tablet by mouth daily. In am  . rosuvastatin (CRESTOR) 20 MG tablet Take 1 tablet by mouth  daily. In am  . spironolactone (ALDACTONE) 25 MG tablet Take 0.5 tablets by mouth daily. In am  . ticagrelor (BRILINTA) 60 MG TABS tablet Take 1 tablet (60 mg total) by mouth 2 (two) times daily.  Marland Kitchen tiotropium (SPIRIVA HANDIHALER) 18 MCG inhalation capsule Place 18 mcg into inhaler and inhale daily. In am  . torsemide (DEMADEX) 20 MG tablet Take 20 mg by mouth daily. In the morning  . traZODone (DESYREL) 100 MG tablet Take 1-2 tablets by mouth at bedtime as needed for sleep.     Past Medical History:  Diagnosis Date  . Anemia   . Brachial vein thrombus, right ([place]) 2005  . CAD (coronary artery disease)   . Carotid artery plaque 07/2012, 02/2013   removal of carotid artery plaque...bilaterally  . Colon polyps   . Depression   . Endometrial cancer ([place]) 09/2007  . Esophagitis   . Gastritis   . GERD (gastroesophageal reflux disease)   . H/O colonoscopy 2014  . H/O mammogram 2015  . History of brachytherapy 01/2008   finished 2009  . History of chemotherapy    carboplatin, paclitaxel x 4 cycles  . Hyperlipidemia   . Hypertension   . Left ventricular hypertrophy   . Mitral valve regurgitation   . Osteoporosis    tx with evista started  . PAD (peripheral artery disease) ([place])   . PVD (peripheral vascular disease) ([place])   . Stented coronary artery 2004  . Tricuspid regurgitation  Past Surgical History:  Procedure Laterality Date  . CAROTID ENDARTERECTOMY Bilateral 2014  . CARPAL TUNNEL RELEASE Right 08/23/2015   Procedure: CARPAL TUNNEL RELEASE;  Surgeon: Dereck Leep, MD;  Location: [facility] ORS;  Service: Orthopedics;  Laterality: Right;  . CORONARY ARTERY BYPASS GRAFT     stent placement in her circumflex coronary artery laminectomy  . FEMORAL ARTERY - FEMORAL ARTERY BYPASS GRAFT Left 2004   At [facility]  . FEMORAL ARTERY - POPLITEAL ARTERY BYPASS GRAFT Right 2004   right femoral graft  . PERIPHERAL VASCULAR CATHETERIZATION Left 07/01/2016   Procedure: Carotid PTA/Stent  Intervention;  Surgeon: Katha Cabal, MD;  Location: [place] [facility];  Service: Cardiovascular;  Laterality: Left;  . PERIPHERAL VASCULAR CATHETERIZATION Left 07/29/2016   Procedure: Carotid PTA/Stent Intervention;  Surgeon: Katha Cabal, MD;  Location: [place] [facility];  Service: Cardiovascular;  Laterality: Left;  . ROTATOR CUFF REPAIR Right 2015  . TOTAL ABDOMINAL HYSTERECTOMY W/ BILATERAL SALPINGOOPHORECTOMY  09/2007    Social History Social History  Substance Use Topics  . Smoking status: Former Smoker    Packs/day: 1.00    Years: 25.00    Types: Cigarettes    Quit date: 04/30/2006  . Smokeless tobacco: Never Used     Comment: quit 2007  . Alcohol use 1.2 oz/week    1 Glasses of wine, 1 Cans of beer per week     Comment: socially    Family History Family History  Problem Relation Age of Onset  . Breast cancer Other     niece  . Breast cancer Daughter 4  No family history of bleeding/clotting disorders, porphyria or autoimmune disease   Allergies  Allergen Reactions  . Bisphosphonates Shortness Of Breath  . Codeine Itching  . Clopidogrel Bisulfate Other (See Comments)    Decreased blood count  . Tape Other (See Comments)    Takes my skin off. It is ok to use paper tape. Takes my skin off. It is ok to use paper tape.  . Lisinopril Cough     REVIEW OF SYSTEMS (Negative unless checked)  Constitutional: [] Weight loss  [] Fever  [] Chills Cardiac: [] Chest pain   [] Chest pressure   [] Palpitations   [] Shortness of breath when laying flat   [] Shortness of breath with exertion. Vascular:  [] Pain in legs with walking   [] Pain in legs at rest  [] History of DVT   [] Phlebitis   [] Swelling in legs   [] Varicose veins   [] Non-healing ulcers Pulmonary:   [] Uses home oxygen   [] Productive cough   [] Hemoptysis   [] Wheeze  [] COPD   [] Asthma Neurologic:  [] Dizziness   [] Seizures   [] History of stroke   [] History of TIA  [] Aphasia   [] Vissual changes   [] Weakness or  numbness in arm   [] Weakness or numbness in leg Musculoskeletal:   [] Joint swelling   [] Joint pain   [] Low back pain Hematologic:  [] Easy bruising  [] Easy bleeding   [] Hypercoagulable state   [] Anemic Gastrointestinal:  [] Diarrhea   [] Vomiting  [] Gastroesophageal reflux/heartburn   [] Difficulty swallowing. Genitourinary:  [] Chronic kidney disease   [] Difficult urination  [] Frequent urination   [] Blood in urine Skin:  [] Rashes   [] Ulcers  Psychological:  [] History of anxiety   []  History of major depression.  Physical Examination  Vitals:   11/05/16 1623  BP: 129/80  Pulse: 65  Resp: 17  Weight: 185 lb (83.9 kg)  Height: 5' (1.524 m)   Body mass index is 36.13 kg/m.  Gen: WD/WN, NAD Head: [place]/AT, No temporalis wasting.  Ear/Nose/Throat: Hearing grossly intact, nares w/o erythema or drainage, poor dentition Eyes: PER, EOMI, sclera nonicteric.  Neck: Supple, no masses.  No bruit or JVD.  Pulmonary:  Good air movement, clear to auscultation bilaterally, no use of accessory muscles.  Cardiac: RRR, normal S1, S2, no Murmurs. Vascular: bilateral carotid bruits, left neck incisional scar well healed Vessel Right Left  Radial Palpable Palpable  Ulnar Palpable Palpable  Brachial Palpable Palpable  Carotid Palpable Palpable  Femoral Palpable Palpable  Popliteal Palpable Palpable  PT Palpable Palpable  DP Palpable Palpable   Gastrointestinal: soft, non-distended. No guarding/no peritoneal signs.  Musculoskeletal: M/S 5/5 throughout.  No deformity or atrophy.  Neurologic: CN 2-12 intact. Pain and light touch intact in extremities.  Symmetrical.  Speech is fluent. Motor exam as listed above. Psychiatric: Judgment intact, Mood & affect appropriate for pt's clinical situation. Dermatologic: No rashes or ulcers noted.  No changes consistent with cellulitis. Lymph : No Cervical lymphadenopathy, no lichenification or skin changes of chronic lymphedema.  CBC Lab Results  Component Value Date    WBC 8.8 07/30/2016   HGB 11.8 (L) 07/30/2016   HCT 35.1 07/30/2016   MCV 83.5 07/30/2016   PLT 139 (L) 07/30/2016    BMET    Component Value Date/Time   NA 141 07/30/2016 0603   NA 136 06/11/2013 1632   K 3.5 07/30/2016 0603   K 4.0 06/18/2014 1215   CL 109 07/30/2016 0603   CL 103 06/11/2013 1632   CO2 25 07/30/2016 0603   CO2 27 06/11/2013 1632   GLUCOSE 118 (H) 07/30/2016 0603   GLUCOSE 107 (H) 06/11/2013 1632   BUN 17 07/30/2016 0603   BUN 27 (H) 06/11/2013 1632   CREATININE 0.79 07/30/2016 0603   CREATININE 1.20 06/11/2013 1632   CALCIUM 8.1 (L) 07/30/2016 0603   CALCIUM 9.6 06/11/2013 1632   GFRNONAA >60 07/30/2016 0603   GFRNONAA 47 (L) 06/11/2013 1632   GFRAA >60 07/30/2016 0603   GFRAA 54 (L) 06/11/2013 1632   CrCl cannot be calculated (Patient's most recent lab result is older than the maximum 21 days allowed.).  COAG Lab Results  Component Value Date   INR 0.89 07/28/2016   INR 1.1 02/25/2013   INR 1.0 07/21/2012    Radiology No results found.   Assessment/Plan 1. Symptomatic stenosis of left carotid artery without infarction Recommend:  The patient is s/p successful left ICA stenting for recurrent stenosis  Duplex ultrasound shows the left ICA is now widely patent  Continue antiplatelet therapy as prescribed Continue management of CAD, HTN and Hyperlipidemia Healthy heart diet,  encouraged exercise at least 4 times per week  Follow up in 6 months with duplex ultrasound and physical exam based on the patient's carotid surgery and <50% stenosis of the bilateral carotid arteries   2. Coronary artery disease of native artery of native heart with stable angina pectoris (HCC) Continue cardiac and antihypertensive medications as already ordered and reviewed, no changes at this time.  Continue statin as ordered and reviewed, no changes at this time  3. Essential hypertension Continue antihypertensive medications as already ordered and reviewed,  no changes at this time.  4. Mixed hyperlipidemia Continue statin as ordered and reviewed, no changes at this time     Hortencia Pilar, MD  11/05/2016 4:57 PM

## 2017-01-11 ENCOUNTER — Other Ambulatory Visit: Payer: Self-pay | Admitting: Physical Medicine and Rehabilitation

## 2017-01-11 DIAGNOSIS — M75102 Unspecified rotator cuff tear or rupture of left shoulder, not specified as traumatic: Secondary | ICD-10-CM

## 2017-01-21 ENCOUNTER — Ambulatory Visit
Admission: RE | Admit: 2017-01-21 | Discharge: 2017-01-21 | Disposition: A | Discharge status: Home / Self Care | Payer: Medicare Other | Source: Ambulatory Visit | Attending: Physical Medicine and Rehabilitation | Admitting: Physical Medicine and Rehabilitation

## 2017-01-21 DIAGNOSIS — M75102 Unspecified rotator cuff tear or rupture of left shoulder, not specified as traumatic: Secondary | ICD-10-CM

## 2017-01-21 DIAGNOSIS — X58XXXA Exposure to other specified factors, initial encounter: Secondary | ICD-10-CM | POA: Insufficient documentation

## 2017-01-21 DIAGNOSIS — S46022A Laceration of muscle(s) and tendon(s) of the rotator cuff of left shoulder, initial encounter: Secondary | ICD-10-CM | POA: Insufficient documentation

## 2017-02-17 DIAGNOSIS — M25512 Pain in left shoulder: Secondary | ICD-10-CM | POA: Insufficient documentation

## 2017-02-25 ENCOUNTER — Other Ambulatory Visit: Payer: Self-pay | Admitting: *Deleted

## 2017-02-25 NOTE — Telephone Encounter (Signed)
Denied Premarin cream, patient not seen at [facility] by Dr Theora Gianotti since 2016 and not seen by Dr Grayland Ormond since 2015

## 2017-03-01 ENCOUNTER — Emergency Department
Admission: EM | Admit: 2017-03-01 | Discharge: 2017-03-01 | Disposition: A | Discharge status: Home / Self Care | Payer: Medicare Other | Attending: Emergency Medicine | Admitting: Emergency Medicine

## 2017-03-01 ENCOUNTER — Encounter: Payer: Self-pay | Admitting: *Deleted

## 2017-03-01 DIAGNOSIS — Z79899 Other long term (current) drug therapy: Secondary | ICD-10-CM | POA: Diagnosis not present

## 2017-03-01 DIAGNOSIS — I2510 Atherosclerotic heart disease of native coronary artery without angina pectoris: Secondary | ICD-10-CM | POA: Diagnosis not present

## 2017-03-01 DIAGNOSIS — I10 Essential (primary) hypertension: Secondary | ICD-10-CM | POA: Insufficient documentation

## 2017-03-01 DIAGNOSIS — L089 Local infection of the skin and subcutaneous tissue, unspecified: Secondary | ICD-10-CM | POA: Diagnosis present

## 2017-03-01 DIAGNOSIS — Z87891 Personal history of nicotine dependence: Secondary | ICD-10-CM | POA: Diagnosis not present

## 2017-03-01 DIAGNOSIS — Z7982 Long term (current) use of aspirin: Secondary | ICD-10-CM | POA: Insufficient documentation

## 2017-03-01 DIAGNOSIS — B9689 Other specified bacterial agents as the cause of diseases classified elsewhere: Secondary | ICD-10-CM

## 2017-03-01 MED ORDER — SULFAMETHOXAZOLE-TRIMETHOPRIM 800-160 MG PO TABS: 1.0000 | ORAL_TABLET | Freq: Two times a day (BID) | ORAL | 0 refills | Status: DC

## 2017-03-01 NOTE — Discharge Instructions (Signed)
Warm compresses to the area when possible. Begin taking antibiotics twice a day for the next 7 days. Tylenol if needed for pain as needed. Follow-up with Dr. Ouida Sills if any continued problems.

## 2017-03-01 NOTE — ED Provider Notes (Signed)
[facility] Emergency Department Provider Note   ____________________________________________   First MD Initiated Contact with Patient 03/01/17 1003     (approximate)  I have reviewed the triage vital signs and the nursing notes.   HISTORY  Chief Complaint Insect Bite    HPI Alyssa Murillo is a 71 y.o. female is here with complaint of a "insect bite"to her back. Patient states that initially she could not see in the mirror however in the last 24 hours it is raised and becoming more painful. She is unaware of any fever. She denies any previous problems with MRSA. Currently she rates her pain is 7 out of 10.   Past Medical History:  Diagnosis Date  . Anemia   . Brachial vein thrombus, right ([place]) 2005  . CAD (coronary artery disease)   . Carotid artery plaque 07/2012, 02/2013   removal of carotid artery plaque...bilaterally  . Colon polyps   . Depression   . Endometrial cancer ([place]) 09/2007  . Esophagitis   . Gastritis   . GERD (gastroesophageal reflux disease)   . H/O colonoscopy 2014  . H/O mammogram 2015  . History of brachytherapy 01/2008   finished 2009  . History of chemotherapy    carboplatin, paclitaxel x 4 cycles  . Hyperlipidemia   . Hypertension   . Left ventricular hypertrophy   . Mitral valve regurgitation   . Osteoporosis    tx with evista started  . PAD (peripheral artery disease) ([place])   . PVD (peripheral vascular disease) ([place])   . Stented coronary artery 2004  . Tricuspid regurgitation     Patient Active Problem List   Diagnosis Date Noted  . CAD (coronary artery disease) 11/05/2016  . Essential hypertension 11/05/2016  . Hyperlipidemia 11/05/2016  . Carotid stenosis, symptomatic w/o infarct 07/29/2016  . IBS (irritable bowel syndrome) 08/01/2015  . Vaginal dryness 05/01/2015  . Personal history of malignant neoplasm of uterus 08/25/2014  . Adult BMI 30+ 08/25/2014    Past Surgical History:  Procedure  Laterality Date  . CAROTID ENDARTERECTOMY Bilateral 2014  . CARPAL TUNNEL RELEASE Right 08/23/2015   Procedure: CARPAL TUNNEL RELEASE;  Surgeon: Dereck Leep, MD;  Location: [facility] ORS;  Service: Orthopedics;  Laterality: Right;  . CORONARY ARTERY BYPASS GRAFT     stent placement in her circumflex coronary artery laminectomy  . FEMORAL ARTERY - FEMORAL ARTERY BYPASS GRAFT Left 2004   At [facility]  . FEMORAL ARTERY - POPLITEAL ARTERY BYPASS GRAFT Right 2004   right femoral graft  . PERIPHERAL VASCULAR CATHETERIZATION Left 07/01/2016   Procedure: Carotid PTA/Stent Intervention;  Surgeon: Katha Cabal, MD;  Location: [place] [facility];  Service: Cardiovascular;  Laterality: Left;  . PERIPHERAL VASCULAR CATHETERIZATION Left 07/29/2016   Procedure: Carotid PTA/Stent Intervention;  Surgeon: Katha Cabal, MD;  Location: [place] [facility];  Service: Cardiovascular;  Laterality: Left;  . ROTATOR CUFF REPAIR Right 2015  . TOTAL ABDOMINAL HYSTERECTOMY W/ BILATERAL SALPINGOOPHORECTOMY  09/2007    Prior to Admission medications   Medication Sig Start Date End Date Taking? Authorizing Provider  albuterol (PROAIR HFA) 108 (90 BASE) MCG/ACT inhaler Inhale 2 puffs into the lungs every 6 (six) hours as needed for wheezing.     Historical Provider, MD  aspirin 325 MG tablet Take 1 tablet by mouth daily.    Historical Provider, MD  colchicine-probenecid 0.5-500 MG per tablet Take 1 tablet by mouth 2 (two) times daily as needed (gout).  01/14/15  06/26/16  Historical Provider, MD  conjugated estrogens (PREMARIN) vaginal cream Place 1 Applicatorful vaginally 3 (three) times a week. 09/16/16   Lloyd Huger, MD  esomeprazole (NEXIUM) 40 MG capsule 1 tablet daily. 01/15/15   Historical Provider, MD  furosemide (LASIX) 40 MG tablet Take 1 tablet by mouth daily. 09/28/14   Historical Provider, MD  gabapentin (NEURONTIN) 100 MG capsule Take 300 capsules by mouth 3 (three) times daily.  11/05/14 06/26/16   Historical Provider, MD  isosorbide mononitrate (IMDUR) 30 MG 24 hr tablet Take 1 tablet by mouth daily. In am 09/28/14   Historical Provider, MD  nitroGLYCERIN (NITROSTAT) 0.4 MG SL tablet Place 0.4 mg under the tongue every 5 (five) minutes as needed for chest pain. 09/20/15 09/19/16  Historical Provider, MD  Probiotic Product (PROBIOTIC ADVANCED PO) Take 1 tablet by mouth 3 (three) times daily.    Historical Provider, MD  propranolol (INDERAL) 80 MG tablet Take 1 tablet by mouth daily. In am 09/28/14   Historical Provider, MD  rosuvastatin (CRESTOR) 20 MG tablet Take 1 tablet by mouth daily. In am 09/28/14   Historical Provider, MD  spironolactone (ALDACTONE) 25 MG tablet Take 0.5 tablets by mouth daily. In am 09/28/14   Historical Provider, MD  sulfamethoxazole-trimethoprim (BACTRIM DS,SEPTRA DS) 800-160 MG tablet Take 1 tablet by mouth 2 (two) times daily. 03/01/17   Johnn Hai, PA-C  ticagrelor (BRILINTA) 60 MG TABS tablet Take 1 tablet (60 mg total) by mouth 2 (two) times daily. 07/31/16   Kimberly A Stegmayer, PA-C  tiotropium (SPIRIVA HANDIHALER) 18 MCG inhalation capsule Place 18 mcg into inhaler and inhale daily. In am 01/21/15   Historical Provider, MD  torsemide (DEMADEX) 20 MG tablet Take 20 mg by mouth daily. In the morning    Historical Provider, MD  traZODone (DESYREL) 100 MG tablet Take 1-2 tablets by mouth at bedtime as needed for sleep.  12/03/14   Historical Provider, MD    Allergies Bisphosphonates; Codeine; Clopidogrel bisulfate; Tape; and Lisinopril  Family History  Problem Relation Age of Onset  . Breast cancer Other     niece  . Breast cancer Daughter 24    Social History Social History  Substance Use Topics  . Smoking status: Former Smoker    Packs/day: 1.00    Years: 25.00    Types: Cigarettes    Quit date: 04/30/2006  . Smokeless tobacco: Never Used     Comment: quit 2007  . Alcohol use 1.2 oz/week    1 Glasses of wine, 1 Cans of beer per week      Comment: socially    Review of Systems Constitutional: No fever/chills Eyes: No visual changes. Cardiovascular: Denies chest pain. Respiratory: Denies shortness of breath. Musculoskeletal: Negative for back pain. Skin: Positive for skin infection. 10-point ROS otherwise negative.  ____________________________________________   PHYSICAL EXAM:  VITAL SIGNS: ED Triage Vitals  Enc Vitals Group     BP 03/01/17 0913 133/72     Pulse Rate 03/01/17 0913 65     Resp 03/01/17 0913 18     Temp 03/01/17 0913 98.4 F (36.9 C)     Temp Source 03/01/17 0913 Oral     SpO2 03/01/17 0913 95 %     Weight 03/01/17 0914 165 lb (74.8 kg)     Height 03/01/17 0914 5' (1.524 m)     Head Circumference --      Peak Flow --      Pain Score 03/01/17 0913 7  Pain Loc --      Pain Edu? --      Excl. in [place]? --     Constitutional: Alert and oriented. Well appearing and in no acute distress. Eyes: Conjunctivae are normal. PERRL. EOMI. Head: Atraumatic. Nose: No congestion/rhinnorhea. Mouth/Throat: Mucous membranes are moist.  Oropharynx non-erythematous. Neck: No stridor.   Cardiovascular: Normal rate, regular rhythm. Grossly normal heart sounds.  Good peripheral circulation. Respiratory: Normal respiratory effort.  No retractions. Lungs CTAB. Musculoskeletal: Moves upper and lower extremity swelling difficulty. Normal gait was noted. Neurologic:  Normal speech and language. No gross focal neurologic deficits are appreciated. No gait instability. Skin:  Skin is warm, dry and intact. One single lesion on the back with minimal erythema and a centralized pustule. There is no cystic formation appreciated on palpation. Psychiatric: Mood and affect are normal. Speech and behavior are normal.  ____________________________________________   LABS (all labs ordered are listed, but only abnormal results are displayed)  Labs Reviewed - No data to display  PROCEDURES  Procedure(s) performed:  None  Procedures  Critical Care performed: No  ____________________________________________   INITIAL IMPRESSION / ASSESSMENT AND PLAN / ED COURSE  Pertinent labs & imaging results that were available during my care of the patient were reviewed by me and considered in my medical decision making (see chart for details).  Patient was placed on Bactrim DS twice a day for 7 days. She is also to use warm compresses frequently to the area. She will follow-up with Dr. Ouida Sills if any continued problems and continue Tylenol or ibuprofen as needed for discomfort. Patient is afebrile in the department but no surrounding cellulitis present.      ____________________________________________   FINAL CLINICAL IMPRESSION(S) / ED DIAGNOSES  Final diagnoses:  Skin infection, bacterial      NEW MEDICATIONS STARTED DURING THIS VISIT:  Discharge Medication List as of 03/01/2017 10:40 AM    START taking these medications   Details  sulfamethoxazole-trimethoprim (BACTRIM DS,SEPTRA DS) 800-160 MG tablet Take 1 tablet by mouth 2 (two) times daily., Starting Mon 03/01/2017, Print         Note:  This document was prepared using Dragon voice recognition software and may include unintentional dictation errors.    Johnn Hai, PA-C 03/01/17 1853    Lavonia Drafts, MD 03/02/17 [phone]

## 2017-03-01 NOTE — ED Triage Notes (Signed)
States she noticed a raised area on her back on Saturday, states she think she got bit by something, raised area with white center noticed on upper back, dime size

## 2017-03-01 NOTE — ED Notes (Signed)
See triage note   Possible insect bite to right mid back  Small red rasied area note to back   No fever noted

## 2017-05-10 ENCOUNTER — Encounter (INDEPENDENT_AMBULATORY_CARE_PROVIDER_SITE_OTHER): Payer: Medicare Other

## 2017-05-10 ENCOUNTER — Ambulatory Visit (INDEPENDENT_AMBULATORY_CARE_PROVIDER_SITE_OTHER): Payer: Medicare Other | Admitting: Vascular Surgery

## 2017-07-29 ENCOUNTER — Encounter: Payer: Self-pay | Admitting: Emergency Medicine

## 2017-07-29 ENCOUNTER — Emergency Department: Payer: Medicare Other

## 2017-07-29 ENCOUNTER — Emergency Department
Admission: EM | Admit: 2017-07-29 | Discharge: 2017-07-29 | Disposition: A | Discharge status: Home / Self Care | Payer: Medicare Other | Attending: Emergency Medicine | Admitting: Emergency Medicine

## 2017-07-29 DIAGNOSIS — Z79899 Other long term (current) drug therapy: Secondary | ICD-10-CM | POA: Insufficient documentation

## 2017-07-29 DIAGNOSIS — Y929 Unspecified place or not applicable: Secondary | ICD-10-CM | POA: Diagnosis not present

## 2017-07-29 DIAGNOSIS — Y998 Other external cause status: Secondary | ICD-10-CM | POA: Diagnosis not present

## 2017-07-29 DIAGNOSIS — I2510 Atherosclerotic heart disease of native coronary artery without angina pectoris: Secondary | ICD-10-CM | POA: Diagnosis not present

## 2017-07-29 DIAGNOSIS — Z951 Presence of aortocoronary bypass graft: Secondary | ICD-10-CM | POA: Insufficient documentation

## 2017-07-29 DIAGNOSIS — Z7982 Long term (current) use of aspirin: Secondary | ICD-10-CM | POA: Insufficient documentation

## 2017-07-29 DIAGNOSIS — Z87891 Personal history of nicotine dependence: Secondary | ICD-10-CM | POA: Insufficient documentation

## 2017-07-29 DIAGNOSIS — S92352A Displaced fracture of fifth metatarsal bone, left foot, initial encounter for closed fracture: Secondary | ICD-10-CM | POA: Diagnosis not present

## 2017-07-29 DIAGNOSIS — W010XXA Fall on same level from slipping, tripping and stumbling without subsequent striking against object, initial encounter: Secondary | ICD-10-CM | POA: Diagnosis not present

## 2017-07-29 DIAGNOSIS — Y9301 Activity, walking, marching and hiking: Secondary | ICD-10-CM | POA: Diagnosis not present

## 2017-07-29 DIAGNOSIS — I10 Essential (primary) hypertension: Secondary | ICD-10-CM | POA: Diagnosis not present

## 2017-07-29 DIAGNOSIS — S99922A Unspecified injury of left foot, initial encounter: Secondary | ICD-10-CM | POA: Diagnosis present

## 2017-07-29 NOTE — ED Triage Notes (Signed)
Patient presents to ED via POV from home with c/o left foot pain x 2 weeks. Patient tripped and hit her foot.

## 2017-07-29 NOTE — ED Notes (Signed)
See triage note  Presents with pain to left foot  States she stubbed her left foot about 2 weeks ago and twisted her foot  conts to have pain and swelling ot lateral aspect of foot

## 2017-07-29 NOTE — ED Provider Notes (Signed)
[facility] Emergency Department Provider Note  ____________________________________________  Time seen: Approximately 1:43 PM  I have reviewed the triage vital signs and the nursing notes.   HISTORY  Chief Complaint Foot Pain    HPI Alyssa Murillo is a 71 y.o. female that presents to emergency room with left foot pain after falling 2 weeks ago. Patient states that she tripped and twisted her foot inward. She says it is painful on the outside and has been swelling since injury. No additional injuries. No numbness or tingling.   Past Medical History:  Diagnosis Date  . Anemia   . Brachial vein thrombus, right ([place]) 2005  . CAD (coronary artery disease)   . Carotid artery plaque 07/2012, 02/2013   removal of carotid artery plaque...bilaterally  . Colon polyps   . Depression   . Endometrial cancer ([place]) 09/2007  . Esophagitis   . Gastritis   . GERD (gastroesophageal reflux disease)   . H/O colonoscopy 2014  . H/O mammogram 2015  . History of brachytherapy 01/2008   finished 2009  . History of chemotherapy    carboplatin, paclitaxel x 4 cycles  . Hyperlipidemia   . Hypertension   . Left ventricular hypertrophy   . Mitral valve regurgitation   . Osteoporosis    tx with evista started  . PAD (peripheral artery disease) ([place])   . PVD (peripheral vascular disease) ([place])   . Stented coronary artery 2004  . Tricuspid regurgitation     Patient Active Problem List   Diagnosis Date Noted  . CAD (coronary artery disease) 11/05/2016  . Essential hypertension 11/05/2016  . Hyperlipidemia 11/05/2016  . Carotid stenosis, symptomatic w/o infarct 07/29/2016  . IBS (irritable bowel syndrome) 08/01/2015  . Vaginal dryness 05/01/2015  . Personal history of malignant neoplasm of uterus 08/25/2014  . Adult BMI 30+ 08/25/2014    Past Surgical History:  Procedure Laterality Date  . CAROTID ENDARTERECTOMY Bilateral 2014  . CARPAL TUNNEL RELEASE Right  08/23/2015   Procedure: CARPAL TUNNEL RELEASE;  Surgeon: Dereck Leep, MD;  Location: [facility] ORS;  Service: Orthopedics;  Laterality: Right;  . CORONARY ARTERY BYPASS GRAFT     stent placement in her circumflex coronary artery laminectomy  . FEMORAL ARTERY - FEMORAL ARTERY BYPASS GRAFT Left 2004   At [facility]  . FEMORAL ARTERY - POPLITEAL ARTERY BYPASS GRAFT Right 2004   right femoral graft  . PERIPHERAL VASCULAR CATHETERIZATION Left 07/01/2016   Procedure: Carotid PTA/Stent Intervention;  Surgeon: Katha Cabal, MD;  Location: [place] [facility];  Service: Cardiovascular;  Laterality: Left;  . PERIPHERAL VASCULAR CATHETERIZATION Left 07/29/2016   Procedure: Carotid PTA/Stent Intervention;  Surgeon: Katha Cabal, MD;  Location: [place] [facility];  Service: Cardiovascular;  Laterality: Left;  . ROTATOR CUFF REPAIR Right 2015  . TOTAL ABDOMINAL HYSTERECTOMY W/ BILATERAL SALPINGOOPHORECTOMY  09/2007    Prior to Admission medications   Medication Sig Start Date End Date Taking? Authorizing Provider  albuterol (PROAIR HFA) 108 (90 BASE) MCG/ACT inhaler Inhale 2 puffs into the lungs every 6 (six) hours as needed for wheezing.     [provider]  aspirin 325 MG tablet Take 1 tablet by mouth daily.    [provider]  colchicine-probenecid 0.5-500 MG per tablet Take 1 tablet by mouth 2 (two) times daily as needed (gout).  01/14/15 06/26/16  [provider]  conjugated estrogens (PREMARIN) vaginal cream Place 1 Applicatorful vaginally 3 (three) times a week. 09/16/16  Lloyd Huger, MD  esomeprazole (NEXIUM) 40 MG capsule 1 tablet daily. 01/15/15   [provider]  furosemide (LASIX) 40 MG tablet Take 1 tablet by mouth daily. 09/28/14   [provider]  gabapentin (NEURONTIN) 100 MG capsule Take 300 capsules by mouth 3 (three) times daily.  11/05/14 06/26/16  [provider]  isosorbide mononitrate (IMDUR) 30 MG 24 hr tablet Take 1 tablet  by mouth daily. In am 09/28/14   [provider]  nitroGLYCERIN (NITROSTAT) 0.4 MG SL tablet Place 0.4 mg under the tongue every 5 (five) minutes as needed for chest pain. 09/20/15 09/19/16  [provider]  Probiotic Product (PROBIOTIC ADVANCED PO) Take 1 tablet by mouth 3 (three) times daily.    [provider]  propranolol (INDERAL) 80 MG tablet Take 1 tablet by mouth daily. In am 09/28/14   [provider]  rosuvastatin (CRESTOR) 20 MG tablet Take 1 tablet by mouth daily. In am 09/28/14   [provider]  spironolactone (ALDACTONE) 25 MG tablet Take 0.5 tablets by mouth daily. In am 09/28/14   [provider]  sulfamethoxazole-trimethoprim (BACTRIM DS,SEPTRA DS) 800-160 MG tablet Take 1 tablet by mouth 2 (two) times daily. 03/01/17   Johnn Hai, PA-C  ticagrelor (BRILINTA) 60 MG TABS tablet Take 1 tablet (60 mg total) by mouth 2 (two) times daily. 07/31/16   Stegmayer, Joelene Millin A, PA-C  tiotropium (SPIRIVA HANDIHALER) 18 MCG inhalation capsule Place 18 mcg into inhaler and inhale daily. In am 01/21/15   [provider]  torsemide (DEMADEX) 20 MG tablet Take 20 mg by mouth daily. In the morning    [provider]  traZODone (DESYREL) 100 MG tablet Take 1-2 tablets by mouth at bedtime as needed for sleep.  12/03/14   [provider]    Allergies Bisphosphonates; Codeine; Clopidogrel bisulfate; Tape; and Lisinopril  Family History  Problem Relation Age of Onset  . Breast cancer Other        niece  . Breast cancer Daughter 41    Social History Social History  Substance Use Topics  . Smoking status: Former Smoker    Packs/day: 1.00    Years: 25.00    Types: Cigarettes    Quit date: 04/30/2006  . Smokeless tobacco: Never Used     Comment: quit 2007  . Alcohol use 1.2 oz/week    1 Glasses of wine, 1 Cans of beer per week     Comment: socially     Review of Systems  Constitutional: No  fever/chills Cardiovascular: No chest pain. Respiratory: No SOB. Gastrointestinal: No abdominal pain.  No nausea, no vomiting.  Musculoskeletal: Positive for foot pain. Skin: Negative for rash, abrasions, lacerations, ecchymosis. Neurological: Negative for headaches, numbness or tingling   ____________________________________________   PHYSICAL EXAM:  VITAL SIGNS: ED Triage Vitals  Enc Vitals Group     BP 07/29/17 1207 131/79     Pulse Rate 07/29/17 1207 71     Resp 07/29/17 1207 15     Temp --      Temp src --      SpO2 07/29/17 1207 96 %     Weight 07/29/17 1205 169 lb (76.7 kg)     Height 07/29/17 1205 5' (1.524 m)     Head Circumference --      Peak Flow --      Pain Score 07/29/17 1205 7     Pain Loc --      Pain Edu? --  Excl. in [place]? --      Constitutional: Alert and oriented. Well appearing and in no acute distress. Eyes: Conjunctivae are normal. PERRL. EOMI. Head: Atraumatic. ENT:      Ears:      Nose: No congestion/rhinnorhea.      Mouth/Throat: Mucous membranes are moist.  Neck: No stridor.   Cardiovascular: Normal rate, regular rhythm.  Good peripheral circulation. Palpable dorsalis pedis pulses. Respiratory: Normal respiratory effort without tachypnea or retractions. Lungs CTAB. Good air entry to the bases with no decreased or absent breath sounds. Musculoskeletal: Full range of motion to all extremities. No gross deformities appreciated. Tenderness to palpation over fifth metatarsal. No swelling. No bruising. Neurologic:  Normal speech and language. No gross focal neurologic deficits are appreciated.  Skin:  Skin is warm, dry and intact.    ____________________________________________   LABS (all labs ordered are listed, but only abnormal results are displayed)  Labs Reviewed - No data to display ____________________________________________  EKG   ____________________________________________  RADIOLOGY Robinette Haines, personally viewed  and evaluated these images (plain radiographs) as part of my medical decision making, as well as reviewing the written report by the radiologist.  Dg Foot Complete Left  Result Date: 07/29/2017 CLINICAL DATA:  Twisting injury 2 weeks ago with persistent pain, initial encounter EXAM: LEFT FOOT - COMPLETE 3+ VIEW COMPARISON:  04/19/2012 FINDINGS: There is a minimally displaced fracture through the base of the fifth metatarsal proximally. Mild soft tissue swelling is noted. No other fractures are seen. Degenerative changes at the first MTP joint are noted. IMPRESSION: Minimally displaced fracture at the base of the fifth metatarsal. Electronically Signed   By: Inez Catalina M.D.   On: 07/29/2017 13:21    ____________________________________________    PROCEDURES  Procedure(s) performed:    Procedures    Medications - No data to display   ____________________________________________   INITIAL IMPRESSION / ASSESSMENT AND PLAN / ED COURSE  Pertinent labs & imaging results that were available during my care of the patient were reviewed by me and considered in my medical decision making (see chart for details).  Review of the [place] CSRS was performed in accordance of the [place] prior to dispensing any controlled drugs.   Patient presented to the emergency department for evaluation of foot pain after fall 2 weeks ago. X-ray consistent with fracture at the base of the fifth metatarsal. She was given a shoe and a walker. Patient is to follow up with orthopedics as directed. Patient is given ED precautions to return to the ED for any worsening or new symptoms.     ____________________________________________  FINAL CLINICAL IMPRESSION(S) / ED DIAGNOSES  Final diagnoses:  Closed displaced fracture of fifth metatarsal bone of left foot, initial encounter      NEW MEDICATIONS STARTED DURING THIS VISIT:  Discharge Medication List as of 07/29/2017  2:08 PM          This chart was  dictated using voice recognition software/Dragon. Despite best efforts to proofread, errors can occur which can change the meaning. Any change was purely unintentional.    Laban Emperor, PA-C 07/29/17 1832    Harvest Dark, MD 08/01/17 [phone]

## 2017-08-17 ENCOUNTER — Other Ambulatory Visit: Payer: Self-pay | Admitting: Internal Medicine

## 2017-08-17 DIAGNOSIS — Z1231 Encounter for screening mammogram for malignant neoplasm of breast: Secondary | ICD-10-CM

## 2017-08-18 ENCOUNTER — Other Ambulatory Visit: Payer: Self-pay | Admitting: Physical Medicine and Rehabilitation

## 2017-08-18 DIAGNOSIS — M5416 Radiculopathy, lumbar region: Secondary | ICD-10-CM

## 2017-08-24 ENCOUNTER — Ambulatory Visit
Admission: RE | Admit: 2017-08-24 | Discharge: 2017-08-24 | Disposition: A | Discharge status: Home / Self Care | Payer: Medicare Other | Source: Ambulatory Visit | Attending: Physical Medicine and Rehabilitation | Admitting: Physical Medicine and Rehabilitation

## 2017-08-24 ENCOUNTER — Ambulatory Visit: Payer: Medicare Other

## 2017-08-24 DIAGNOSIS — M48061 Spinal stenosis, lumbar region without neurogenic claudication: Secondary | ICD-10-CM | POA: Diagnosis not present

## 2017-08-24 DIAGNOSIS — M2548 Effusion, other site: Secondary | ICD-10-CM | POA: Diagnosis not present

## 2017-08-24 DIAGNOSIS — M5416 Radiculopathy, lumbar region: Secondary | ICD-10-CM

## 2017-08-24 DIAGNOSIS — M5126 Other intervertebral disc displacement, lumbar region: Secondary | ICD-10-CM | POA: Diagnosis not present

## 2017-09-17 IMAGING — MG MM DIGITAL SCREENING BILAT W/ TOMO W/ CAD
8 of 13 series · 8 of 29 positions shown · non-contrast
Comparison: Previous exam(s).

CLINICAL DATA: Screening.

EXAM:
2D DIGITAL SCREENING BILATERAL MAMMOGRAM WITH CAD AND ADJUNCT TOMO

[R MLO (1 of 2)]
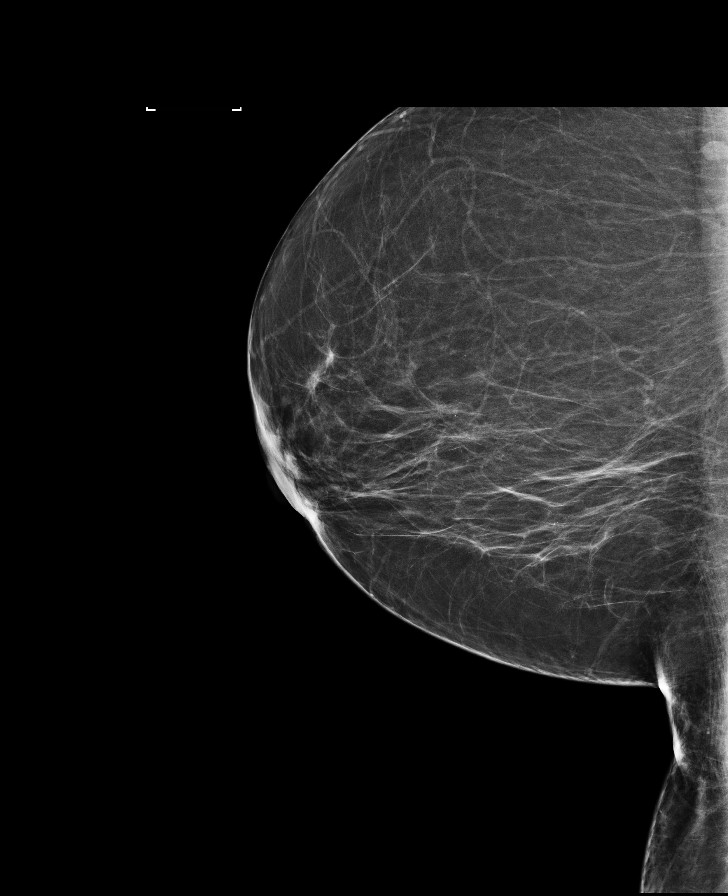

[R MLO (2 of 2)]
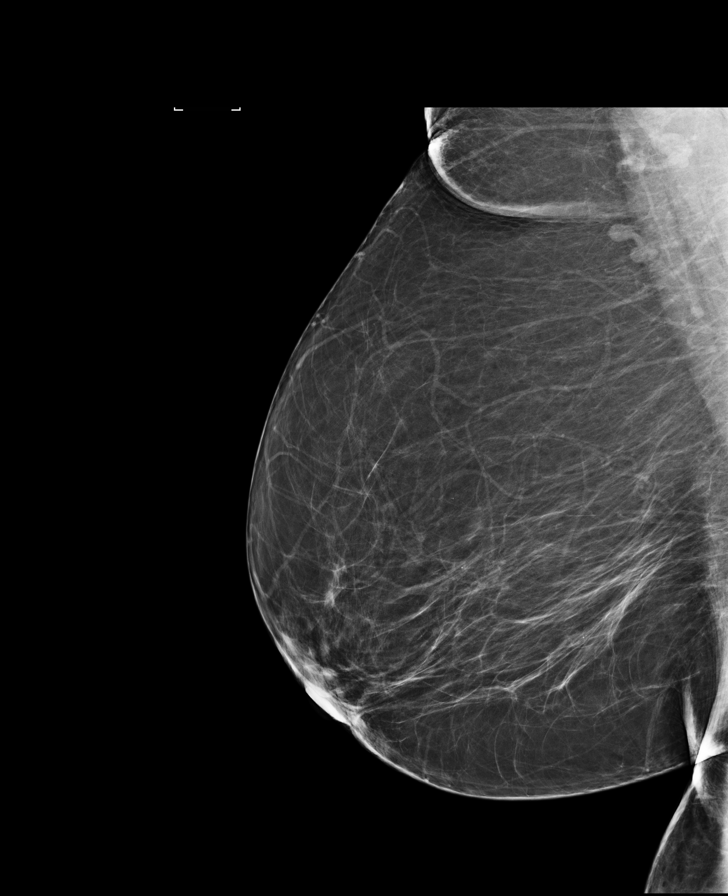

[L MLO]
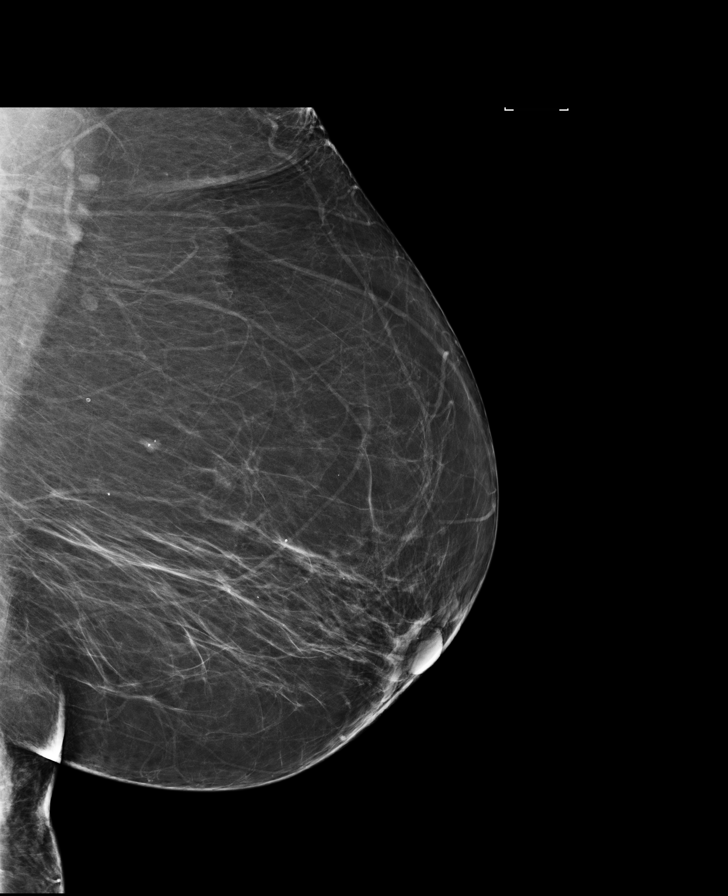

[R CC synth-2D]
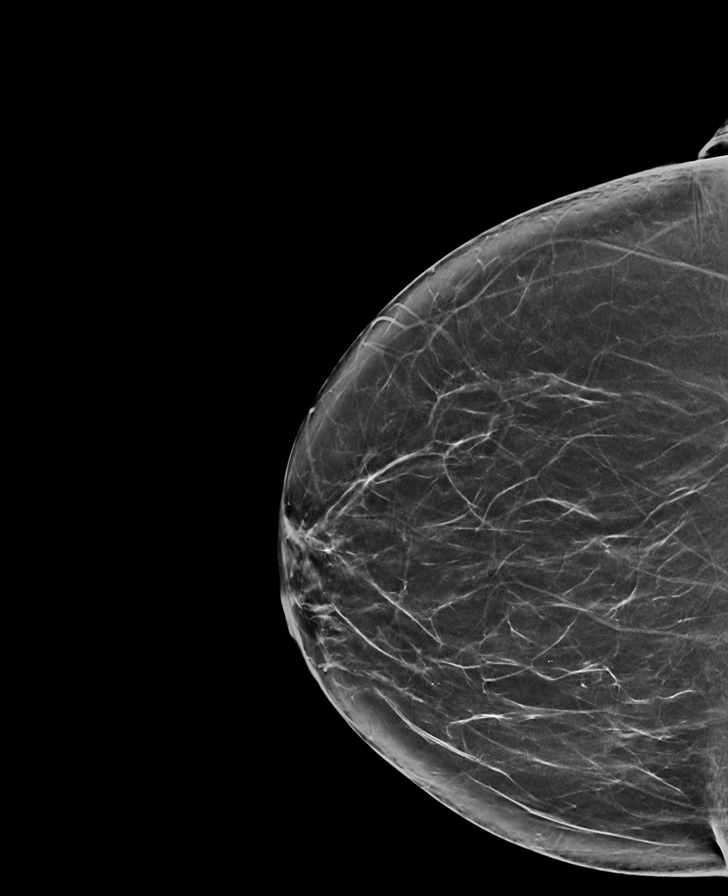

[L CC synth-2D]
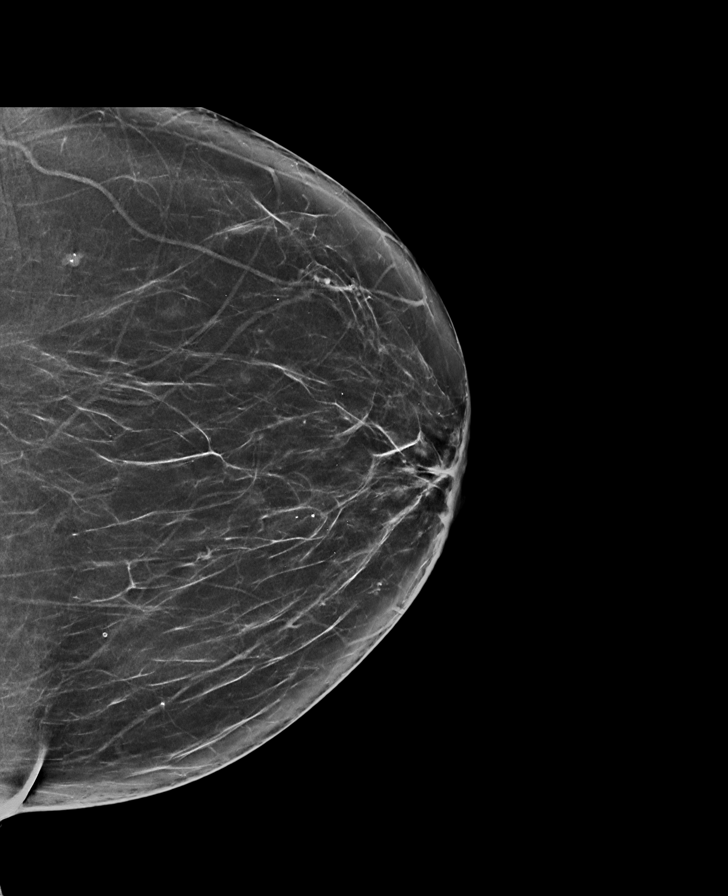

[L CC]
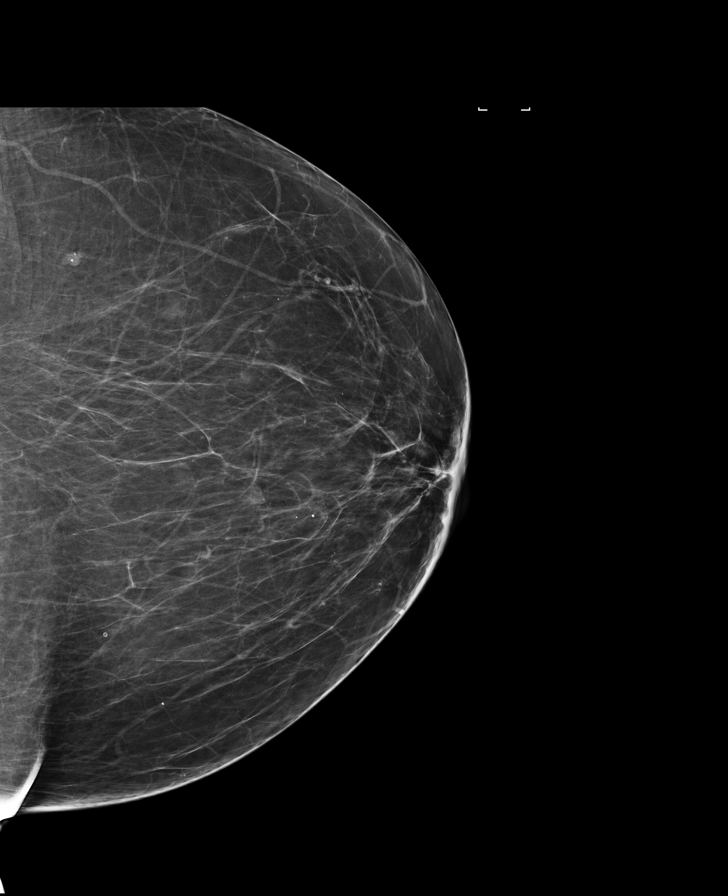

[R MLO synth-2D]
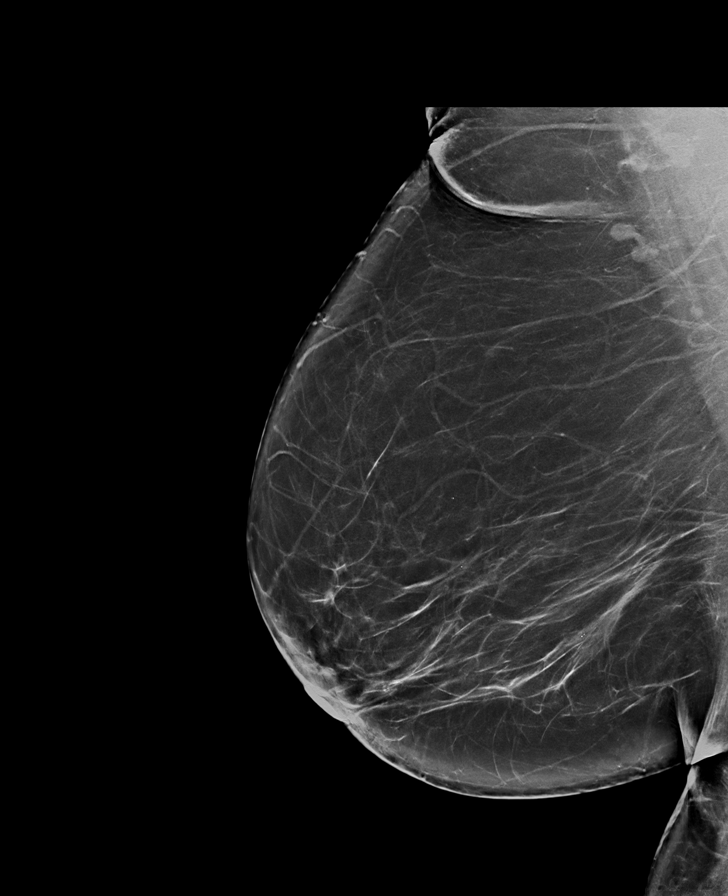

[L MLO synth-2D]
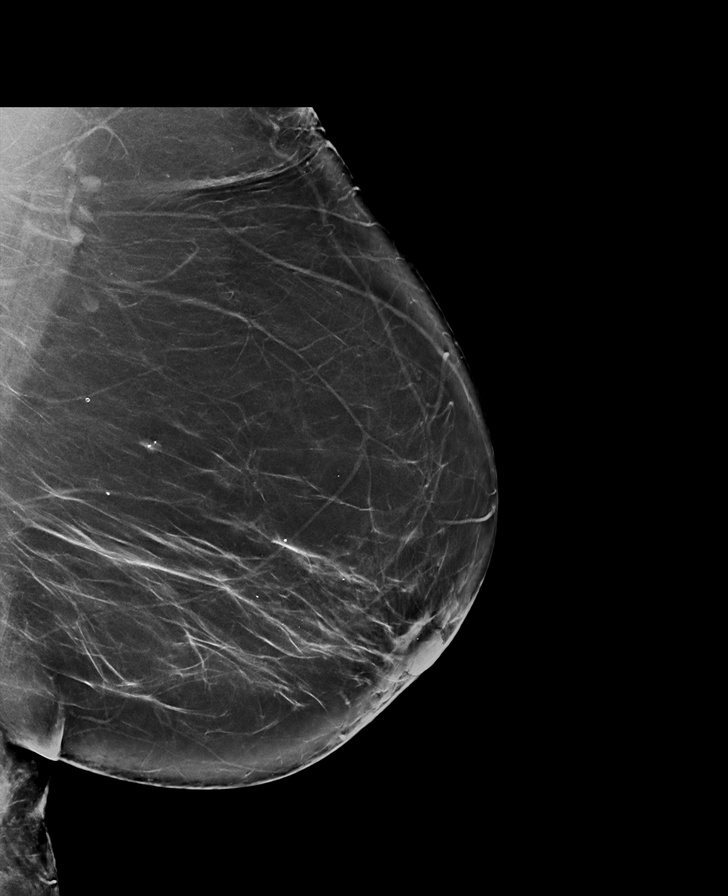

[8 of 29 positions shown; findings below may reference images not displayed]

ACR Breast Density Category b: There are scattered areas of
fibroglandular density.
FINDINGS: There are no findings suspicious for malignancy. Images were
processed with CAD.
IMPRESSION: No mammographic evidence of malignancy. A result letter of this
screening mammogram will be mailed directly to the patient.

RECOMMENDATION:
Screening mammogram in one year. (Code:97-6-RS4)

BI-RADS CATEGORY  1: Negative.

## 2017-12-14 ENCOUNTER — Other Ambulatory Visit: Payer: Self-pay | Admitting: Oncology

## 2017-12-30 ENCOUNTER — Ambulatory Visit
Admission: RE | Admit: 2017-12-30 | Discharge: 2017-12-30 | Disposition: A | Discharge status: Home / Self Care | Payer: Medicare Other | Source: Ambulatory Visit | Attending: Internal Medicine | Admitting: Internal Medicine

## 2017-12-30 DIAGNOSIS — Z1231 Encounter for screening mammogram for malignant neoplasm of breast: Secondary | ICD-10-CM | POA: Insufficient documentation

## 2018-03-15 ENCOUNTER — Other Ambulatory Visit: Payer: Self-pay | Admitting: Oncology

## 2018-08-10 DIAGNOSIS — I440 Atrioventricular block, first degree: Secondary | ICD-10-CM | POA: Insufficient documentation

## 2018-09-07 ENCOUNTER — Other Ambulatory Visit: Payer: Self-pay | Admitting: Nurse Practitioner

## 2018-09-07 DIAGNOSIS — R1031 Right lower quadrant pain: Secondary | ICD-10-CM

## 2018-09-15 ENCOUNTER — Ambulatory Visit
Admission: RE | Admit: 2018-09-15 | Discharge: 2018-09-15 | Disposition: A | Discharge status: Home / Self Care | Payer: Medicare Other | Source: Ambulatory Visit | Attending: Nurse Practitioner | Admitting: Nurse Practitioner

## 2018-09-15 DIAGNOSIS — K5730 Diverticulosis of large intestine without perforation or abscess without bleeding: Secondary | ICD-10-CM | POA: Insufficient documentation

## 2018-09-15 DIAGNOSIS — N261 Atrophy of kidney (terminal): Secondary | ICD-10-CM | POA: Insufficient documentation

## 2018-09-15 DIAGNOSIS — R1031 Right lower quadrant pain: Secondary | ICD-10-CM | POA: Insufficient documentation

## 2018-09-15 MED ORDER — IOPAMIDOL (ISOVUE-300) INJECTION 61%
100.0000 mL | Freq: Once | INTRAVENOUS | Status: AC | PRN
  Administered 2018-09-15: 100 mL via INTRAVENOUS

## 2018-11-15 ENCOUNTER — Other Ambulatory Visit: Payer: Self-pay | Admitting: Oncology

## 2018-12-28 ENCOUNTER — Ambulatory Visit
Admission: RE | Admit: 2018-12-28 | Payer: Medicare Other | Source: Home / Self Care | Admitting: Unknown Physician Specialty

## 2018-12-28 ENCOUNTER — Encounter: Admission: RE | Payer: Self-pay | Source: Home / Self Care

## 2018-12-28 SURGERY — COLONOSCOPY WITH PROPOFOL
Anesthesia: General

## 2019-02-21 ENCOUNTER — Other Ambulatory Visit: Payer: Self-pay | Admitting: Oncology

## 2019-08-21 ENCOUNTER — Encounter (INDEPENDENT_AMBULATORY_CARE_PROVIDER_SITE_OTHER): Payer: Self-pay | Admitting: Vascular Surgery

## 2019-08-21 ENCOUNTER — Other Ambulatory Visit: Payer: Self-pay

## 2019-08-21 ENCOUNTER — Ambulatory Visit (INDEPENDENT_AMBULATORY_CARE_PROVIDER_SITE_OTHER): Payer: Medicare Other | Admitting: Vascular Surgery

## 2019-08-21 VITALS — BP 137/81 | HR 73 | Resp 12 | Ht 59.0 in | Wt 198.0 lb

## 2019-08-21 DIAGNOSIS — I10 Essential (primary) hypertension: Secondary | ICD-10-CM

## 2019-08-21 DIAGNOSIS — I70219 Atherosclerosis of native arteries of extremities with intermittent claudication, unspecified extremity: Secondary | ICD-10-CM | POA: Diagnosis not present

## 2019-08-21 DIAGNOSIS — I6522 Occlusion and stenosis of left carotid artery: Secondary | ICD-10-CM | POA: Diagnosis not present

## 2019-08-21 DIAGNOSIS — E782 Mixed hyperlipidemia: Secondary | ICD-10-CM

## 2019-08-21 DIAGNOSIS — I25118 Atherosclerotic heart disease of native coronary artery with other forms of angina pectoris: Secondary | ICD-10-CM | POA: Diagnosis not present

## 2019-08-21 DIAGNOSIS — I890 Lymphedema, not elsewhere classified: Secondary | ICD-10-CM | POA: Insufficient documentation

## 2019-08-21 NOTE — Progress Notes (Signed)
MRN : [id number]  Alyssa Murillo is a 73 y.o. (11-03-1946) female who presents with chief complaint of  Chief Complaint  Patient presents with  . Follow-up  .  History of Present Illness:   The patient returns to the office for followup regarding left leg pain.  The patient is also seen for follow up evaluation of carotid stenosis. She is s/p left carotid stenting for recurrent stenosis after previous CEA.  The procedure was without complication on [id number]. The carotid stenosis followed by ultrasound.   The patient denies amaurosis fugax. There is no recent history of TIA symptoms or focal motor deficits. There is no prior documented CVA.  There has been a significant deterioration in the lower extremity symptoms.  The patient notes interval shortening of their claudication distance and development of mild rest pain symptoms. No new ulcers or wounds have occurred since the last visit.  The patient denies history of DVT, PE or superficial thrombophlebitis. The patient denies recent episodes of angina or shortness of breath.   The patient is taking enteric-coated aspirin 81 mg daily.  There is no history of migraine headaches. There is no history of seizures.  The patient has a history of coronary artery disease, no recent episodes of angina or shortness of breath. The patient denies PAD or claudication symptoms. There is a history of hyperlipidemia which is being treated with a statin.   Previous Carotid Duplex shows widely patent LICA stent.    Current Meds  Medication Sig  . albuterol (PROAIR HFA) 108 (90 BASE) MCG/ACT inhaler Inhale 2 puffs into the lungs every 6 (six) hours as needed for wheezing.   Marland Kitchen aspirin 325 MG tablet Take 1 tablet by mouth daily.  . colchicine-probenecid 0.5-500 MG per tablet Take 1 tablet by mouth 2 (two) times daily as needed (gout).   Marland Kitchen esomeprazole (NEXIUM) 40 MG capsule 1 tablet daily.  . isosorbide mononitrate (IMDUR) 30 MG 24 hr  tablet Take 1 tablet by mouth daily. In am  . nitroGLYCERIN (NITROSTAT) 0.4 MG SL tablet Place 0.4 mg under the tongue every 5 (five) minutes as needed for chest pain.  Marland Kitchen PREMARIN vaginal cream PLACE 1 APPLICATORFUL VAGINALLY 3 (THREE) TIMES A WEEK.  . propranolol (INDERAL) 80 MG tablet Take 1 tablet by mouth daily. In am  . rosuvastatin (CRESTOR) 20 MG tablet Take 1 tablet by mouth daily. In am  . spironolactone (ALDACTONE) 25 MG tablet Take 0.5 tablets by mouth daily. In am  . tiotropium (SPIRIVA HANDIHALER) 18 MCG inhalation capsule Place 18 mcg into inhaler and inhale daily. In am  . torsemide (DEMADEX) 20 MG tablet Take 20 mg by mouth daily. In the morning  . traZODone (DESYREL) 100 MG tablet Take 1-2 tablets by mouth at bedtime as needed for sleep.     Past Medical History:  Diagnosis Date  . Anemia   . Brachial vein thrombus, right ([place]) 2005  . CAD (coronary artery disease)   . Carotid artery plaque 07/2012, 02/2013   removal of carotid artery plaque...bilaterally  . Colon polyps   . Depression   . Endometrial cancer ([place]) 09/2007  . Esophagitis   . Gastritis   . GERD (gastroesophageal reflux disease)   . H/O colonoscopy 2014  . H/O mammogram 2015  . History of brachytherapy 01/2008   finished 2009  . History of chemotherapy    carboplatin, paclitaxel x 4 cycles  . Hyperlipidemia   . Hypertension   . Left ventricular hypertrophy   .  Mitral valve regurgitation   . Osteoporosis    tx with evista started  . PAD (peripheral artery disease) ([place])   . PVD (peripheral vascular disease) ([place])   . Stented coronary artery 2004  . Tricuspid regurgitation     Past Surgical History:  Procedure Laterality Date  . ABDOMINAL HYSTERECTOMY    . CAROTID ENDARTERECTOMY Bilateral 2014  . CARPAL TUNNEL RELEASE Right 08/23/2015   Procedure: CARPAL TUNNEL RELEASE;  Surgeon: Dereck Leep, MD;  Location: [facility] ORS;  Service: Orthopedics;  Laterality: Right;  . CORONARY ARTERY BYPASS GRAFT      stent placement in her circumflex coronary artery laminectomy  . FEMORAL ARTERY - FEMORAL ARTERY BYPASS GRAFT Left 2004   At [facility]  . FEMORAL ARTERY - POPLITEAL ARTERY BYPASS GRAFT Right 2004   right femoral graft  . PERIPHERAL VASCULAR CATHETERIZATION Left 07/01/2016   Procedure: Carotid PTA/Stent Intervention;  Surgeon: Katha Cabal, MD;  Location: [place] [facility];  Service: Cardiovascular;  Laterality: Left;  . PERIPHERAL VASCULAR CATHETERIZATION Left 07/29/2016   Procedure: Carotid PTA/Stent Intervention;  Surgeon: Katha Cabal, MD;  Location: [place] [facility];  Service: Cardiovascular;  Laterality: Left;  . ROTATOR CUFF REPAIR Right 2015  . TOTAL ABDOMINAL HYSTERECTOMY W/ BILATERAL SALPINGOOPHORECTOMY  09/2007    Social History Social History   Tobacco Use  . Smoking status: Former Smoker    Packs/day: 1.00    Years: 25.00    Pack years: 25.00    Types: Cigarettes    Quit date: 04/30/2006    Years since quitting: 13.3  . Smokeless tobacco: Never Used  . Tobacco comment: quit 2007  Substance Use Topics  . Alcohol use: Yes    Alcohol/week: 2.0 standard drinks    Types: 1 Glasses of wine, 1 Cans of beer per week    Comment: socially  . Drug use: No    Family History Family History  Problem Relation Age of Onset  . Breast cancer Other        niece  . Breast cancer Daughter 29    Allergies  Allergen Reactions  . Bisphosphonates Shortness Of Breath  . Codeine Itching  . Clopidogrel Bisulfate Other (See Comments)    Decreased blood count  . Tape Other (See Comments)    Takes my skin off. It is ok to use paper tape. Takes my skin off. It is ok to use paper tape.  . Lisinopril Cough     REVIEW OF SYSTEMS (Negative unless checked)  Constitutional: [] Weight loss  [] Fever  [] Chills Cardiac: [] Chest pain   [] Chest pressure   [] Palpitations   [] Shortness of breath when laying flat   [x] Shortness of breath with exertion. Vascular:  [x] Pain in legs  with walking   [] Pain in legs at rest  [] History of DVT   [] Phlebitis   [x] Swelling in legs   [] Varicose veins   [] Non-healing ulcers Pulmonary:   [] Uses home oxygen   [] Productive cough   [] Hemoptysis   [] Wheeze  [] COPD   [] Asthma Neurologic:  [] Dizziness   [] Seizures   [] History of stroke   [] History of TIA  [] Aphasia   [] Vissual changes   [] Weakness or numbness in arm   [] Weakness or numbness in leg Musculoskeletal:   [] Joint swelling   [x] Joint pain   [x] Low back pain Hematologic:  [] Easy bruising  [] Easy bleeding   [] Hypercoagulable state   [] Anemic Gastrointestinal:  [] Diarrhea   [] Vomiting  [] Gastroesophageal reflux/heartburn   [] Difficulty swallowing. Genitourinary:  [] Chronic  kidney disease   [] Difficult urination  [] Frequent urination   [] Blood in urine Skin:  [] Rashes   [] Ulcers  Psychological:  [] History of anxiety   []  History of major depression.  Physical Examination  Vitals:   08/21/19 1412  BP: 137/81  Pulse: 73  Resp: 12  Weight: 198 lb (89.8 kg)  Height: 4\' 11"  (1.499 m)   Body mass index is 39.99 kg/m. Gen: WD/WN, NAD Head: [place]/AT, No temporalis wasting.  Ear/Nose/Throat: Hearing grossly intact, nares w/o erythema or drainage Eyes: PER, EOMI, sclera nonicteric.  Neck: Supple, no large masses.   Pulmonary:  Good air movement, no audible wheezing bilaterally, no use of accessory muscles.  Cardiac: RRR, no JVD Vascular:   Vessel Right Left  Radial Palpable Palpable  PT Not Palpable Not  Palpable  DP Not Palpable Not Palpable  Gastrointestinal: Non-distended. No guarding/no peritoneal signs.  Musculoskeletal: M/S 5/5 throughout.  No deformity or atrophy.  Neurologic: CN 2-12 intact. Symmetrical.  Speech is fluent. Motor exam as listed above. Psychiatric: Judgment intact, Mood & affect appropriate for pt's clinical situation. Dermatologic: No rashes or ulcers noted.  No changes consistent with cellulitis. Lymph : No lichenification or skin changes of chronic  lymphedema.  CBC Lab Results  Component Value Date   WBC 8.8 07/30/2016   HGB 11.8 (L) 07/30/2016   HCT 35.1 07/30/2016   MCV 83.5 07/30/2016   PLT 139 (L) 07/30/2016    BMET    Component Value Date/Time   NA 141 07/30/2016 0603   NA 136 06/11/2013 1632   K 3.5 07/30/2016 0603   K 4.0 06/18/2014 1215   CL 109 07/30/2016 0603   CL 103 06/11/2013 1632   CO2 25 07/30/2016 0603   CO2 27 06/11/2013 1632   GLUCOSE 118 (H) 07/30/2016 0603   GLUCOSE 107 (H) 06/11/2013 1632   BUN 17 07/30/2016 0603   BUN 27 (H) 06/11/2013 1632   CREATININE 0.79 07/30/2016 0603   CREATININE 1.20 06/11/2013 1632   CALCIUM 8.1 (L) 07/30/2016 0603   CALCIUM 9.6 06/11/2013 1632   GFRNONAA >60 07/30/2016 0603   GFRNONAA 47 (L) 06/11/2013 1632   GFRAA >60 07/30/2016 0603   GFRAA 54 (L) 06/11/2013 1632   CrCl cannot be calculated (Patient's most recent lab result is older than the maximum 21 days allowed.).  COAG Lab Results  Component Value Date   INR 0.89 07/28/2016   INR 1.1 02/25/2013   INR 1.0 07/21/2012    Radiology No results found.   Assessment/Plan 1. Symptomatic stenosis of left carotid artery without infarction Recommend:  Given the patient's asymptomatic subcritical stenosis no further invasive testing or surgery at this time.  Duplex ultrasound is over due and will be ordered  Continue antiplatelet therapy as prescribed Continue management of CAD, HTN and Hyperlipidemia Healthy heart diet,  encouraged exercise at least 4 times per week Follow up with duplex ultrasound and physical exam   - VAS US CAROTID; Future  2. Atherosclerotic peripheral vascular disease with intermittent claudication (HCC)  Recommend:  The patient has evidence of atherosclerosis of the lower extremities with claudication.  The patient does voice lifestyle limiting changes at this point in time particular to the left leg.  No invasive studies, angiography or surgery at this time The patient  should continue walking and begin a more formal exercise program.  The patient should continue antiplatelet therapy and aggressive treatment of the lipid abnormalities  No changes in the patient's medications at this time  The patient should continue wearing graduated compression socks 10-15 mmHg strength to control the mild edema.   - VAS [place] LOWER EXTREMITY ARTERIAL DUPLEX; Future - VAS [place] ABI WITH/WO TBI; Future  3. Essential hypertension Continue antihypertensive medications as already ordered, these medications have been reviewed and there are no changes at this time.   4. Coronary artery disease of native artery of native heart with stable angina pectoris (HCC) Continue cardiac and antihypertensive medications as already ordered and reviewed, no changes at this time.  Continue statin as ordered and reviewed, no changes at this time  Nitrates PRN for chest pain   5. Mixed hyperlipidemia Continue statin as ordered and reviewed, no changes at this time   Hortencia Pilar, MD  08/21/2019 2:31 PM

## 2019-09-01 ENCOUNTER — Encounter (INDEPENDENT_AMBULATORY_CARE_PROVIDER_SITE_OTHER): Payer: Medicare Other

## 2019-09-01 ENCOUNTER — Ambulatory Visit (INDEPENDENT_AMBULATORY_CARE_PROVIDER_SITE_OTHER): Payer: Medicare Other | Admitting: Nurse Practitioner

## 2019-09-08 ENCOUNTER — Ambulatory Visit (INDEPENDENT_AMBULATORY_CARE_PROVIDER_SITE_OTHER): Payer: Medicare Other | Admitting: Nurse Practitioner

## 2019-09-08 ENCOUNTER — Ambulatory Visit (INDEPENDENT_AMBULATORY_CARE_PROVIDER_SITE_OTHER): Payer: Medicare Other

## 2019-09-08 ENCOUNTER — Other Ambulatory Visit: Payer: Self-pay

## 2019-09-08 ENCOUNTER — Encounter (INDEPENDENT_AMBULATORY_CARE_PROVIDER_SITE_OTHER): Payer: Self-pay | Admitting: Nurse Practitioner

## 2019-09-08 VITALS — BP 157/87 | HR 65 | Resp 16 | Ht 60.0 in | Wt 198.0 lb

## 2019-09-08 DIAGNOSIS — I6522 Occlusion and stenosis of left carotid artery: Secondary | ICD-10-CM

## 2019-09-08 DIAGNOSIS — I70219 Atherosclerosis of native arteries of extremities with intermittent claudication, unspecified extremity: Secondary | ICD-10-CM | POA: Diagnosis not present

## 2019-09-08 DIAGNOSIS — E782 Mixed hyperlipidemia: Secondary | ICD-10-CM | POA: Diagnosis not present

## 2019-09-08 DIAGNOSIS — I6523 Occlusion and stenosis of bilateral carotid arteries: Secondary | ICD-10-CM

## 2019-09-08 DIAGNOSIS — I10 Essential (primary) hypertension: Secondary | ICD-10-CM

## 2019-09-11 ENCOUNTER — Encounter (INDEPENDENT_AMBULATORY_CARE_PROVIDER_SITE_OTHER): Payer: Self-pay | Admitting: Nurse Practitioner

## 2019-09-11 ENCOUNTER — Telehealth (INDEPENDENT_AMBULATORY_CARE_PROVIDER_SITE_OTHER): Payer: Self-pay

## 2019-09-11 ENCOUNTER — Encounter (INDEPENDENT_AMBULATORY_CARE_PROVIDER_SITE_OTHER): Payer: Self-pay

## 2019-09-11 NOTE — Progress Notes (Signed)
SUBJECTIVE:  Patient ID: Alyssa Murillo, female    DOB: 09-Nov-1946, 73 y.o.   MRN: [id number] Chief Complaint  Patient presents with  . Follow-up    ultrasound    HPI  Alyssa Murillo is a 73 y.o. female The patient is seen for follow up evaluation of carotid stenosis status post  Bilateral carotid endarterectomy with subsequent left carotid stent placement after recurrent ICA stenosis.  There were no post operative problems or complications related to the surgery.  The patient denies neck or incisional pain.  The patient denies interval amaurosis fugax. There is no recent history of TIA symptoms or focal motor deficits. There is no prior documented CVA.  The patient denies headache.  The patient is taking enteric-coated aspirin 81 mg daily.  There has been a significant deterioration in the lower extremity symptoms.  The patient notes interval shortening of their claudication distance and development of mild rest pain symptoms. No new ulcers or wounds have occurred since the last visit.  The patient has a previous graft placed.  She has a right to left femorofemoral bypass.  The patient states that her first bypass failed and the second 1 worked and has been working ever since.  Currently, the patient has no evidence of stenosis within her bilateral internal carotid arteries with only minimal wall thickening.  Previously placed stent is patent.    ABI's Rt=0.89 and Lt=0.87 (no previous ABIs) Duplex US of the lower extremity arterial system shows monophasic waveforms within the bilateral tibial arteries.  The left arterial duplex reveals biphasic waveforms at the distal common femoral artery with a 50 to 74% stenosis.  It transitions to monophasic waveforms at that point throughout the leg.  Patient's previous stents are patent.   Past Medical History:  Diagnosis Date  . Anemia   . Brachial vein thrombus, right ([place]) 2005  . CAD (coronary artery disease)   . Carotid artery  plaque 07/2012, 02/2013   removal of carotid artery plaque...bilaterally  . Colon polyps   . Depression   . Endometrial cancer ([place]) 09/2007  . Esophagitis   . Gastritis   . GERD (gastroesophageal reflux disease)   . H/O colonoscopy 2014  . H/O mammogram 2015  . History of brachytherapy 01/2008   finished 2009  . History of chemotherapy    carboplatin, paclitaxel x 4 cycles  . Hyperlipidemia   . Hypertension   . Left ventricular hypertrophy   . Mitral valve regurgitation   . Osteoporosis    tx with evista started  . PAD (peripheral artery disease) ([place])   . PVD (peripheral vascular disease) ([place])   . Stented coronary artery 2004  . Tricuspid regurgitation     Past Surgical History:  Procedure Laterality Date  . ABDOMINAL HYSTERECTOMY    . CAROTID ENDARTERECTOMY Bilateral 2014  . CARPAL TUNNEL RELEASE Right 08/23/2015   Procedure: CARPAL TUNNEL RELEASE;  Surgeon: Dereck Leep, MD;  Location: [facility] ORS;  Service: Orthopedics;  Laterality: Right;  . CORONARY ARTERY BYPASS GRAFT     stent placement in her circumflex coronary artery laminectomy  . FEMORAL ARTERY - FEMORAL ARTERY BYPASS GRAFT Left 2004   At [facility]  . FEMORAL ARTERY - POPLITEAL ARTERY BYPASS GRAFT Right 2004   right femoral graft  . PERIPHERAL VASCULAR CATHETERIZATION Left 07/01/2016   Procedure: Carotid PTA/Stent Intervention;  Surgeon: Katha Cabal, MD;  Location: [facility];  Service: Cardiovascular;  Laterality: Left;  . PERIPHERAL VASCULAR CATHETERIZATION Left  07/29/2016   Procedure: Carotid PTA/Stent Intervention;  Surgeon: Katha Cabal, MD;  Location: [facility];  Service: Cardiovascular;  Laterality: Left;  . ROTATOR CUFF REPAIR Right 2015  . TOTAL ABDOMINAL HYSTERECTOMY W/ BILATERAL SALPINGOOPHORECTOMY  09/2007    Social History   Socioeconomic History  . Marital status: Divorced    Spouse name: Not on file  . Number of children: Not on file  . Years of education: Not on  file  . Highest education level: Not on file  Occupational History  . Not on file  Social Needs  . Financial resource strain: Not on file  . Food insecurity    Worry: Not on file    Inability: Not on file  . Transportation needs    Medical: Not on file    Non-medical: Not on file  Tobacco Use  . Smoking status: Former Smoker    Packs/day: 1.00    Years: 25.00    Pack years: 25.00    Types: Cigarettes    Quit date: 04/30/2006    Years since quitting: 13.3  . Smokeless tobacco: Never Used  . Tobacco comment: quit 2007  Substance and Sexual Activity  . Alcohol use: Yes    Alcohol/week: 2.0 standard drinks    Types: 1 Glasses of wine, 1 Cans of beer per week    Comment: socially  . Drug use: No  . Sexual activity: Yes    Birth control/protection: Surgical  Lifestyle  . Physical activity    Days per week: Not on file    Minutes per session: Not on file  . Stress: Not on file  Relationships  . Social Herbalist on phone: Not on file    Gets together: Not on file    Attends religious service: Not on file    Active member of club or organization: Not on file    Attends meetings of clubs or organizations: Not on file    Relationship status: Not on file  . Intimate partner violence    Fear of current or ex partner: Not on file    Emotionally abused: Not on file    Physically abused: Not on file    Forced sexual activity: Not on file  Other Topics Concern  . Not on file  Social History Narrative  . Not on file    Family History  Problem Relation Age of Onset  . Breast cancer Other        niece  . Breast cancer Daughter 44    Allergies  Allergen Reactions  . Bisphosphonates Shortness Of Breath  . Codeine Itching  . Clopidogrel Bisulfate Other (See Comments)    Decreased blood count  . Tape Other (See Comments)    Takes my skin off. It is ok to use paper tape. Takes my skin off. It is ok to use paper tape.  . Lisinopril Cough     Review of  Systems   Review of Systems: Negative Unless Checked Constitutional: [] Weight loss  [] Fever  [] Chills Cardiac: [] Chest pain   []  Atrial Fibrillation  [] Palpitations   [] Shortness of breath when laying flat   [] Shortness of breath with exertion. [] Shortness of breath at rest Vascular:  [] Pain in legs with walking   [] Pain in legs with standing [] Pain in legs when laying flat   [] Claudication    [] Pain in feet when laying flat    [] History of DVT   [] Phlebitis   [] Swelling in legs   []   Varicose veins   [] Non-healing ulcers Pulmonary:   [] Uses home oxygen   [] Productive cough   [] Hemoptysis   [] Wheeze  [] COPD   [] Asthma Neurologic:  [] Dizziness   [] Seizures  [] Blackouts [] History of stroke   [] History of TIA  [] Aphasia   [] Temporary Blindness   [] Weakness or numbness in arm   [] Weakness or numbness in leg Musculoskeletal:   [] Joint swelling   [] Joint pain   [] Low back pain  []  History of Knee Replacement [] Arthritis [] back Surgeries  []  Spinal Stenosis    Hematologic:  [] Easy bruising  [] Easy bleeding   [] Hypercoagulable state   [] Anemic Gastrointestinal:  [] Diarrhea   [] Vomiting  [] Gastroesophageal reflux/heartburn   [] Difficulty swallowing. [] Abdominal pain Genitourinary:  [] Chronic kidney disease   [] Difficult urination  [] Anuric   [] Blood in urine [] Frequent urination  [] Burning with urination   [] Hematuria Skin:  [] Rashes   [] Ulcers [] Wounds Psychological:  [] History of anxiety   []  History of major depression  []  Memory Difficulties      OBJECTIVE:   Physical Exam  BP (!) 157/87 (BP Location: Left Arm)   Pulse 65   Resp 16   Ht 5' (1.524 m)   Wt 198 lb (89.8 kg)   BMI 38.67 kg/m   Gen: WD/WN, NAD Head: /AT, No temporalis wasting.  Ear/Nose/Throat: Hearing grossly intact, nares w/o erythema or drainage Eyes: PER, EOMI, sclera nonicteric.  Neck: Supple, no masses.  No JVD.  Pulmonary:  Good air movement, no use of accessory muscles.  Cardiac: RRR Vascular:  Vessel Right Left   Radial Palpable Palpable  Brachial Palpable Palpable  Femoral Palpable Palpable  Popliteal Palpable Palpable  Dorsalis Pedis Palpable Palpable  Posterior Tibial Palpable Palpable   Gastrointestinal: soft, non-distended. No guarding/no peritoneal signs.  Musculoskeletal: M/S 5/5 throughout.  No deformity or atrophy.  Neurologic: Pain and light touch intact in extremities.  Symmetrical.  Speech is fluent. Motor exam as listed above. Psychiatric: Judgment intact, Mood & affect appropriate for pt's clinical situation. Dermatologic: No Venous rashes. No Ulcers Noted.  No changes consistent with cellulitis. Lymph : No Cervical lymphadenopathy, no lichenification or skin changes of chronic lymphedema.       ASSESSMENT AND PLAN:  1. Atherosclerotic peripheral vascular disease with intermittent claudication (HCC) Recommend:  The patient has experienced increased symptoms and is now describing lifestyle limiting claudication and mild rest pain.   Given the severity of the patient's left lower extremity symptoms the patient should undergo angiography and intervention.  Risk and benefits were reviewed the patient.  Indications for the procedure were reviewed.  All questions were answered, the patient agrees to proceed.   The patient should continue walking and begin a more formal exercise program.  The patient should continue antiplatelet therapy and aggressive treatment of the lipid abnormalities  The patient will follow up with me after the angiogram.   2. Bilateral carotid artery stenosis Recommend:  Given the patient's asymptomatic subcritical stenosis no further invasive testing or surgery at this time.  Currently, the patient has no evidence of stenosis within her bilateral internal carotid arteries with only minimal wall thickening.  Previously placed stent is patent.   Continue antiplatelet therapy as prescribed Continue management of CAD, HTN and Hyperlipidemia Healthy heart  diet,  encouraged exercise at least 4 times per week Follow up in 12 months with duplex ultrasound and physical exam   3. Essential hypertension Continue antihypertensive medications as already ordered, these medications have been reviewed and there are no changes at  this time.   4. Mixed hyperlipidemia Continue statin as ordered and reviewed, no changes at this time    Current Outpatient Medications on File Prior to Visit  Medication Sig Dispense Refill  . albuterol (PROAIR HFA) 108 (90 BASE) MCG/ACT inhaler Inhale 2 puffs into the lungs every 6 (six) hours as needed for wheezing.     Marland Kitchen aspirin 325 MG tablet Take 1 tablet by mouth daily.    Marland Kitchen esomeprazole (NEXIUM) 40 MG capsule 1 tablet daily.    . furosemide (LASIX) 40 MG tablet Take 1 tablet by mouth daily.    . isosorbide mononitrate (IMDUR) 30 MG 24 hr tablet Take 1 tablet by mouth daily. In am    . PREMARIN vaginal cream PLACE 1 APPLICATORFUL VAGINALLY 3 (THREE) TIMES A WEEK. 30 g 2  . propranolol (INDERAL) 80 MG tablet Take 1 tablet by mouth daily. In am    . rosuvastatin (CRESTOR) 20 MG tablet Take 1 tablet by mouth daily. In am    . spironolactone (ALDACTONE) 25 MG tablet Take 0.5 tablets by mouth daily. In am    . tiotropium (SPIRIVA HANDIHALER) 18 MCG inhalation capsule Place 18 mcg into inhaler and inhale daily. In am    . torsemide (DEMADEX) 20 MG tablet Take 20 mg by mouth daily. In the morning    . traZODone (DESYREL) 100 MG tablet Take 1-2 tablets by mouth at bedtime as needed for sleep.     . colchicine-probenecid 0.5-500 MG per tablet Take 1 tablet by mouth 2 (two) times daily as needed (gout).     Marland Kitchen gabapentin (NEURONTIN) 100 MG capsule Take 300 capsules by mouth 3 (three) times daily.     . nitroGLYCERIN (NITROSTAT) 0.4 MG SL tablet Place 0.4 mg under the tongue every 5 (five) minutes as needed for chest pain.    . predniSONE (DELTASONE) 10 MG tablet 6 PILLS X 1 DAY, 5 PILLS X 1 DAY, 4 PILLS X 1 DAY, 3 PILLS X 1 DAY,  2 PILLS X 1 DAY. 1 PILL X 1 DAY.    . Probiotic Product (PROBIOTIC ADVANCED PO) Take 1 tablet by mouth 3 (three) times daily.     No current facility-administered medications on file prior to visit.     There are no Patient Instructions on file for this visit. No follow-ups on file.   Kris Hartmann, NP  This note was completed with Sales executive.  Any errors are purely unintentional.

## 2019-09-11 NOTE — Telephone Encounter (Signed)
Spoke with the patient and she is now scheduled with Dr. Delana Meyer for angio on 09/26/2019 with a 9:00 am arrival time to the [facility]. Patient will do Covid testing on 09/22/2019 between 12:30-2:30 pm at the [facility]. Pre-procedure instructions were discussed and will be mailed to the patient.

## 2019-09-15 ENCOUNTER — Telehealth (INDEPENDENT_AMBULATORY_CARE_PROVIDER_SITE_OTHER): Payer: Self-pay

## 2019-09-15 NOTE — Telephone Encounter (Signed)
Patient called wanting a return call from Eulogio Ditch NP.  Patient stated she has some concerns and questions regarding her LLE procedure on 09/26/2019 and wanted to talk with Arna Medici. Patient also stated she has not received her paperwork regarding her procedure. I explained that it was put in the mail and that I will mail out another pre-procedure instruction sheet to her.

## 2019-09-15 NOTE — Telephone Encounter (Signed)
I have spoken directly with the patient and answered all questions

## 2019-09-22 ENCOUNTER — Other Ambulatory Visit
Admission: RE | Admit: 2019-09-22 | Discharge: 2019-09-22 | Disposition: A | Discharge status: Home / Self Care | Payer: Medicare Other | Source: Ambulatory Visit | Attending: Vascular Surgery | Admitting: Vascular Surgery

## 2019-09-22 ENCOUNTER — Other Ambulatory Visit: Payer: Self-pay

## 2019-09-22 DIAGNOSIS — Z01812 Encounter for preprocedural laboratory examination: Secondary | ICD-10-CM | POA: Diagnosis present

## 2019-09-22 DIAGNOSIS — Z20828 Contact with and (suspected) exposure to other viral communicable diseases: Secondary | ICD-10-CM | POA: Insufficient documentation

## 2019-09-23 LAB — SARS CORONAVIRUS 2 (TAT 6-24 HRS): SARS Coronavirus 2: NEGATIVE

## 2019-09-25 ENCOUNTER — Other Ambulatory Visit (INDEPENDENT_AMBULATORY_CARE_PROVIDER_SITE_OTHER): Payer: Self-pay | Admitting: Nurse Practitioner

## 2019-09-26 ENCOUNTER — Other Ambulatory Visit: Payer: Self-pay

## 2019-09-26 ENCOUNTER — Encounter
Admission: RE | Disposition: A | Discharge status: Home / Self Care | Payer: Self-pay | Source: Home / Self Care | Attending: Vascular Surgery

## 2019-09-26 ENCOUNTER — Ambulatory Visit
Admission: RE | Admit: 2019-09-26 | Discharge: 2019-09-26 | Disposition: A | Discharge status: Home / Self Care | Payer: Medicare Other | Attending: Vascular Surgery | Admitting: Vascular Surgery

## 2019-09-26 DIAGNOSIS — Z87891 Personal history of nicotine dependence: Secondary | ICD-10-CM | POA: Diagnosis not present

## 2019-09-26 DIAGNOSIS — I10 Essential (primary) hypertension: Secondary | ICD-10-CM | POA: Insufficient documentation

## 2019-09-26 DIAGNOSIS — E782 Mixed hyperlipidemia: Secondary | ICD-10-CM | POA: Insufficient documentation

## 2019-09-26 DIAGNOSIS — Z79899 Other long term (current) drug therapy: Secondary | ICD-10-CM | POA: Insufficient documentation

## 2019-09-26 DIAGNOSIS — Z7982 Long term (current) use of aspirin: Secondary | ICD-10-CM | POA: Insufficient documentation

## 2019-09-26 DIAGNOSIS — I70219 Atherosclerosis of native arteries of extremities with intermittent claudication, unspecified extremity: Secondary | ICD-10-CM

## 2019-09-26 DIAGNOSIS — I70212 Atherosclerosis of native arteries of extremities with intermittent claudication, left leg: Secondary | ICD-10-CM | POA: Insufficient documentation

## 2019-09-26 DIAGNOSIS — I70213 Atherosclerosis of native arteries of extremities with intermittent claudication, bilateral legs: Secondary | ICD-10-CM | POA: Diagnosis not present

## 2019-09-26 DIAGNOSIS — I6523 Occlusion and stenosis of bilateral carotid arteries: Secondary | ICD-10-CM | POA: Diagnosis not present

## 2019-09-26 DIAGNOSIS — I2510 Atherosclerotic heart disease of native coronary artery without angina pectoris: Secondary | ICD-10-CM | POA: Insufficient documentation

## 2019-09-26 DIAGNOSIS — F329 Major depressive disorder, single episode, unspecified: Secondary | ICD-10-CM | POA: Diagnosis not present

## 2019-09-26 DIAGNOSIS — K219 Gastro-esophageal reflux disease without esophagitis: Secondary | ICD-10-CM | POA: Diagnosis not present

## 2019-09-26 HISTORY — PX: LOWER EXTREMITY ANGIOGRAPHY: CATH118251

## 2019-09-26 LAB — BUN: BUN: 25 mg/dL — ABNORMAL HIGH (ref 8–23)

## 2019-09-26 LAB — CREATININE, SERUM
Creatinine, Ser: 0.88 mg/dL (ref 0.44–1.00)
GFR calc Af Amer: >60 mL/min (ref >60)
GFR calc non Af Amer: >60 mL/min (ref >60)

## 2019-09-26 SURGERY — LOWER EXTREMITY ANGIOGRAPHY
Anesthesia: Moderate Sedation | Site: Leg Lower | Laterality: Left

## 2019-09-26 MED ORDER — HEPARIN SODIUM (PORCINE) 1000 UNIT/ML IJ SOLN
INTRAMUSCULAR | Status: AC
  Filled 2019-09-26: qty 1

## 2019-09-26 MED ORDER — CEFAZOLIN SODIUM-DEXTROSE 2-4 GM/100ML-% IV SOLN
INTRAVENOUS | Status: AC
  Administered 2019-09-26: 2 g via INTRAVENOUS
  Filled 2019-09-26: qty 100

## 2019-09-26 MED ORDER — MIDAZOLAM HCL 2 MG/2ML IJ SOLN
INTRAMUSCULAR | Status: DC | PRN
  Administered 2019-09-26: 1 mg via INTRAVENOUS
  Administered 2019-09-26: 2 mg via INTRAVENOUS

## 2019-09-26 MED ORDER — MIDAZOLAM HCL 2 MG/ML PO SYRP: 8.0000 mg | ORAL_SOLUTION | Freq: Once | ORAL | Status: DC | PRN

## 2019-09-26 MED ORDER — METHYLPREDNISOLONE SODIUM SUCC 125 MG IJ SOLR: 125.0000 mg | Freq: Once | INTRAMUSCULAR | Status: DC | PRN

## 2019-09-26 MED ORDER — FENTANYL CITRATE (PF) 100 MCG/2ML IJ SOLN
INTRAMUSCULAR | Status: AC
  Filled 2019-09-26: qty 2

## 2019-09-26 MED ORDER — MIDAZOLAM HCL 5 MG/5ML IJ SOLN
INTRAMUSCULAR | Status: AC
  Filled 2019-09-26: qty 5

## 2019-09-26 MED ORDER — HYDROMORPHONE HCL 1 MG/ML IJ SOLN: 1.0000 mg | Freq: Once | INTRAMUSCULAR | Status: DC | PRN

## 2019-09-26 MED ORDER — FAMOTIDINE 20 MG PO TABS: 40.0000 mg | ORAL_TABLET | Freq: Once | ORAL | Status: DC | PRN

## 2019-09-26 MED ORDER — ONDANSETRON HCL 4 MG/2ML IJ SOLN: 4.0000 mg | Freq: Four times a day (QID) | INTRAMUSCULAR | Status: DC | PRN

## 2019-09-26 MED ORDER — CEFAZOLIN SODIUM-DEXTROSE 2-4 GM/100ML-% IV SOLN
2.0000 g | Freq: Once | INTRAVENOUS | Status: AC
  Administered 2019-09-26: 10:00:00 2 g via INTRAVENOUS

## 2019-09-26 MED ORDER — SODIUM CHLORIDE 0.9 % IV SOLN
INTRAVENOUS | Status: DC
  Administered 2019-09-26: 10:00:00 via INTRAVENOUS

## 2019-09-26 MED ORDER — DIPHENHYDRAMINE HCL 50 MG/ML IJ SOLN: 50.0000 mg | Freq: Once | INTRAMUSCULAR | Status: DC | PRN

## 2019-09-26 MED ORDER — FENTANYL CITRATE (PF) 100 MCG/2ML IJ SOLN
INTRAMUSCULAR | Status: DC | PRN
  Administered 2019-09-26: 50 ug via INTRAVENOUS
  Administered 2019-09-26: 25 ug via INTRAVENOUS

## 2019-09-26 MED ORDER — IODIXANOL 320 MG/ML IV SOLN
INTRAVENOUS | Status: DC | PRN
  Administered 2019-09-26: 60 mL via INTRA_ARTERIAL

## 2019-09-26 SURGICAL SUPPLY — 10 items
CANNULA 5F STIFF (CANNULA) ×2 IMPLANT
CATH BEACON 5 .035 40 KMP TP (CATHETERS) ×1 IMPLANT
CATH BEACON 5 .038 40 KMP TP (CATHETERS) ×1
CATH PIG 70CM (CATHETERS) ×2 IMPLANT
GUIDEWIRE SUPER STIFF .035X180 (WIRE) ×2 IMPLANT
NEEDLE ENTRY 21GA 7CM ECHOTIP (NEEDLE) ×2 IMPLANT
PACK ANGIOGRAPHY (CUSTOM PROCEDURE TRAY) ×2 IMPLANT
SET INTRO CAPELLA COAXIAL (SET/KITS/TRAYS/PACK) ×2 IMPLANT
SHEATH BRITE TIP 5FRX11 (SHEATH) ×2 IMPLANT
WIRE J 3MM .035X145CM (WIRE) ×2 IMPLANT

## 2019-09-26 NOTE — H&P (Signed)
[facility] History & Physical Update  The patient was interviewed and re-examined.  The patient's previous History and Physical has been reviewed and is unchanged.  There is no change in the plan of care. We plan to proceed with the scheduled procedure.  Hortencia Pilar, MD  09/26/2019, 9:45 AM

## 2019-09-26 NOTE — Discharge Instructions (Signed)

## 2019-09-27 NOTE — Op Note (Signed)
[facility]  Percutaneous Study/Intervention Procedural Note   Date of Surgery: 09/27/2019,4:16 PM  Surgeon:Schnier, Dolores Lory   Pre-operative Diagnosis: Atherosclerotic occlusive disease bilateral lower extremities with lifestyle limiting claudication; stricture stenosis threatening patency of previously placed bypass graft  Post-operative diagnosis:  Same  Procedure(s) Performed:  1.  Abdominal aortogram  2.  Left lower extremity distal runoff third order catheter placement   Anesthesia: Conscious sedation was administered by the interventional radiology RN under my direct supervision. IV Versed plus fentanyl were utilized. Continuous ECG, pulse oximetry and blood pressure was monitored throughout the entire procedure.  Conscious sedation was administered for a total of 25 minutes.  Sheath: 5 French sheath retrograde femoral-femoral bypass graft  Contrast: 60 cc   Fluoroscopy Time: 4 minutes  Indications:  The patient presents to [facility] with increasing pain of her left lower extremity.  She has an extensive vascular history and is status post a remote femoral-femoral bypass left to right as well as multiple interventions.  Noninvasive studies demonstrated hemodynamically significant lesions in the area of the left common femoral that are potentially threatening the pain see of her bypass graft.  Pedal pulses are nonpalpable bilaterally suggesting atherosclerotic occlusive disease.  The risks and benefits as well as alternative therapies for lower extremity revascularization are reviewed with the patient all questions are answered the patient agrees to proceed.  The patient is therefore undergoing angiography with the hope for intervention for limb salvage.   Procedure:  Alyssa Murillo a 73 y.o. female who was identified and appropriate procedural time out was performed.  The patient was then placed supine on the table and prepped and draped in  the usual sterile fashion.  Ultrasound was used to evaluate the right common femoral artery.  It was echolucent and pulsatile indicating it is patent .  An ultrasound image was acquired for the permanent record.  A micropuncture needle was used to access the femoral-femoral bypass just above the right common femoral artery anastomosis under direct ultrasound guidance.  The microwire was then advanced under fluoroscopic guidance without difficulty followed by the micro-sheath.  A 0.035 J wire was advanced without resistance and a 5Fr sheath was placed.    Using a floppy Glidewire and a Kumpe catheter the wire catheter combination is then advanced across the bypass in a retrograde fashion and upward into the iliac system and then the distal aorta. Pigtail catheter was then advanced to the level of T12 and AP projection of the aorta was obtained. Pigtail catheter was then repositioned to above the bifurcation and RAO view of the pelvis was obtained. The catheter was positioned in the distal external iliac artery.  LAO of the left groin was then obtained. Wire was reintroduced and the pigtail catheter exchanged for a Kumpe catheter.  The wire is then negotiated into the SFA with the assistance of the Kumpe catheter and the catheter was advanced into the SFA. Distal runoff was then performed.  After review of the images the catheter was removed over wire and the sheath is pulled and pressure is held.   Findings:   Aortogram: The abdominal aorta is opacified with a bolus injection of contrast.  It is patent there is a mid infrarenal narrowing but this appears to be on the order of 40% and does not appear to be hemodynamically significant.  The left common iliac is widely patent the previously placed stent remains widely patent.  Left external iliac is widely patent.  The  right common iliac artery is occluded there is a small area of flow representing a cul-de-sac external iliac artery is occluded as well on the  right.  Right Lower Extremity: Femorofemoral bypass is patent and fills the right common femoral  Left Lower Extremity: The left common femoral has changes consistent with previous endarterectomy.  The actual anastomosis with the prosthetic femorofemoral bypass appears patent there is some tortuosity.  There is perhaps 20 to 30% hyperplasia noted but there is no hemodynamically significant stenosis within the left femoral system including the actual anastomosis.  The left SFA demonstrates a greater than 60% stenosis at Hunter's canal and there appears to be diffuse disease within the tibial vessels at the level of the trifurcation.  In summary: The aorta and left iliac system is widely patent.  Previously placed stent is patent.  The anastomosis and femoral-femoral bypass also appears to be patent without hemodynamically significant stenosis.  There does not appear to be a threat to the patency of her inflow.   Disposition: Patient was taken to the recovery room in stable condition having tolerated the procedure well.  Belenda Cruise Schnier 09/27/2019,4:16 PM

## 2019-09-28 ENCOUNTER — Encounter: Payer: Self-pay | Admitting: Vascular Surgery

## 2019-10-09 ENCOUNTER — Ambulatory Visit (INDEPENDENT_AMBULATORY_CARE_PROVIDER_SITE_OTHER): Payer: Medicare Other | Admitting: Vascular Surgery

## 2019-10-19 ENCOUNTER — Ambulatory Visit (INDEPENDENT_AMBULATORY_CARE_PROVIDER_SITE_OTHER): Payer: Medicare Other | Admitting: Vascular Surgery

## 2019-10-23 ENCOUNTER — Encounter (INDEPENDENT_AMBULATORY_CARE_PROVIDER_SITE_OTHER): Payer: Self-pay | Admitting: Vascular Surgery

## 2019-10-23 ENCOUNTER — Other Ambulatory Visit: Payer: Self-pay

## 2019-10-23 ENCOUNTER — Ambulatory Visit (INDEPENDENT_AMBULATORY_CARE_PROVIDER_SITE_OTHER): Payer: Medicare Other | Admitting: Vascular Surgery

## 2019-10-23 VITALS — BP 122/61 | HR 71 | Resp 16 | Wt 196.6 lb

## 2019-10-23 DIAGNOSIS — I70219 Atherosclerosis of native arteries of extremities with intermittent claudication, unspecified extremity: Secondary | ICD-10-CM

## 2019-10-23 DIAGNOSIS — E1122 Type 2 diabetes mellitus with diabetic chronic kidney disease: Secondary | ICD-10-CM

## 2019-10-23 DIAGNOSIS — I10 Essential (primary) hypertension: Secondary | ICD-10-CM | POA: Diagnosis not present

## 2019-10-23 DIAGNOSIS — I6523 Occlusion and stenosis of bilateral carotid arteries: Secondary | ICD-10-CM

## 2019-10-23 DIAGNOSIS — I25118 Atherosclerotic heart disease of native coronary artery with other forms of angina pectoris: Secondary | ICD-10-CM

## 2019-10-23 DIAGNOSIS — J449 Chronic obstructive pulmonary disease, unspecified: Secondary | ICD-10-CM

## 2019-10-23 DIAGNOSIS — N182 Chronic kidney disease, stage 2 (mild): Secondary | ICD-10-CM

## 2019-10-23 NOTE — Progress Notes (Signed)
MRN : [id number]  Alyssa Murillo is a 73 y.o. (18-Jul-1946) female who presents with chief complaint of  Chief Complaint  Patient presents with  . Follow-up    [facility] 2week   .  History of Present Illness:   The patient returns to the office for followup and review status post angiogram. The patient notes improvement in the lower extremity symptoms. No interval shortening of the patient's claudication distance or rest pain symptoms. Previous wounds have now healed.  No new ulcers or wounds have occurred since the last visit.  There have been no significant changes to the patient's overall health care.  The patient denies amaurosis fugax or recent TIA symptoms. There are no recent neurological changes noted. The patient denies history of DVT, PE or superficial thrombophlebitis. The patient denies recent episodes of angina or shortness of breath.    Current Meds  Medication Sig  . albuterol (PROAIR HFA) 108 (90 BASE) MCG/ACT inhaler Inhale 2 puffs into the lungs every 6 (six) hours as needed for wheezing.   Marland Kitchen aspirin 325 MG tablet Take 325 mg by mouth daily.   Marland Kitchen esomeprazole (NEXIUM) 40 MG capsule Take 40 mg by mouth daily.   . isosorbide mononitrate (IMDUR) 30 MG 24 hr tablet Take 1 tablet by mouth daily. In am  . PREMARIN vaginal cream PLACE 1 APPLICATORFUL VAGINALLY 3 (THREE) TIMES A WEEK. (Patient taking differently: Place 1 Applicatorful vaginally 3 (three) times a week. )  . propranolol (INDERAL) 80 MG tablet Take 1 tablet by mouth daily. In am  . rosuvastatin (CRESTOR) 20 MG tablet Take 1 tablet by mouth daily. In am  . spironolactone (ALDACTONE) 25 MG tablet Take 0.5 tablets by mouth daily. In am  . tiotropium (SPIRIVA HANDIHALER) 18 MCG inhalation capsule Place 18 mcg into inhaler and inhale daily. In am  . torsemide (DEMADEX) 20 MG tablet Take 20 mg by mouth daily. In the morning  . traZODone (DESYREL) 100 MG tablet Take 1-2 tablets by mouth at bedtime as needed for  sleep.     Past Medical History:  Diagnosis Date  . Anemia   . Brachial vein thrombus, right ([place]) 2005  . CAD (coronary artery disease)   . Carotid artery plaque 07/2012, 02/2013   removal of carotid artery plaque...bilaterally  . Colon polyps   . Depression   . Endometrial cancer ([place]) 09/2007  . Esophagitis   . Gastritis   . GERD (gastroesophageal reflux disease)   . H/O colonoscopy 2014  . H/O mammogram 2015  . History of brachytherapy 01/2008   finished 2009  . History of chemotherapy    carboplatin, paclitaxel x 4 cycles  . Hyperlipidemia   . Hypertension   . Left ventricular hypertrophy   . Mitral valve regurgitation   . Osteoporosis    tx with evista started  . PAD (peripheral artery disease) ([place])   . PVD (peripheral vascular disease) ([place])   . Stented coronary artery 2004  . Tricuspid regurgitation     Past Surgical History:  Procedure Laterality Date  . ABDOMINAL HYSTERECTOMY    . CAROTID ENDARTERECTOMY Bilateral 2014  . CARPAL TUNNEL RELEASE Right 08/23/2015   Procedure: CARPAL TUNNEL RELEASE;  Surgeon: Dereck Leep, MD;  Location: [facility] ORS;  Service: Orthopedics;  Laterality: Right;  . CORONARY ARTERY BYPASS GRAFT     stent placement in her circumflex coronary artery laminectomy  . FEMORAL ARTERY - FEMORAL ARTERY BYPASS GRAFT Left 2004   At [facility]  .  FEMORAL ARTERY - POPLITEAL ARTERY BYPASS GRAFT Right 2004   right femoral graft  . LOWER EXTREMITY ANGIOGRAPHY Left 09/26/2019   Procedure: LOWER EXTREMITY ANGIOGRAPHY;  Surgeon: Katha Cabal, MD;  Location: [place] [facility];  Service: Cardiovascular;  Laterality: Left;  . PERIPHERAL VASCULAR CATHETERIZATION Left 07/01/2016   Procedure: Carotid PTA/Stent Intervention;  Surgeon: Katha Cabal, MD;  Location: [place] [facility];  Service: Cardiovascular;  Laterality: Left;  . PERIPHERAL VASCULAR CATHETERIZATION Left 07/29/2016   Procedure: Carotid PTA/Stent Intervention;  Surgeon: Katha Cabal,  MD;  Location: [place] [facility];  Service: Cardiovascular;  Laterality: Left;  . ROTATOR CUFF REPAIR Right 2015  . TOTAL ABDOMINAL HYSTERECTOMY W/ BILATERAL SALPINGOOPHORECTOMY  09/2007    Social History Social History   Tobacco Use  . Smoking status: Former Smoker    Packs/day: 1.00    Years: 25.00    Pack years: 25.00    Types: Cigarettes    Quit date: 04/30/2006    Years since quitting: 13.4  . Smokeless tobacco: Never Used  . Tobacco comment: quit 2007  Substance Use Topics  . Alcohol use: Yes    Alcohol/week: 2.0 standard drinks    Types: 1 Glasses of wine, 1 Cans of beer per week    Comment: socially  . Drug use: No    Family History Family History  Problem Relation Age of Onset  . Breast cancer Other        niece  . Breast cancer Daughter 40    Allergies  Allergen Reactions  . Bisphosphonates Shortness Of Breath  . Codeine Itching  . Clopidogrel Bisulfate Other (See Comments)    Decreased blood count  . Tape Other (See Comments)    Takes my skin off. It is ok to use paper tape. Takes my skin off. It is ok to use paper tape.  . Lisinopril Cough     REVIEW OF SYSTEMS (Negative unless checked)  Constitutional: [] Weight loss  [] Fever  [] Chills Cardiac: [] Chest pain   [] Chest pressure   [] Palpitations   [] Shortness of breath when laying flat   [] Shortness of breath with exertion. Vascular:  [x] Pain in legs with walking   [] Pain in legs at rest  [] History of DVT   [] Phlebitis   [] Swelling in legs   [] Varicose veins   [] Non-healing ulcers Pulmonary:   [] Uses home oxygen   [] Productive cough   [] Hemoptysis   [] Wheeze  [] COPD   [] Asthma Neurologic:  [] Dizziness   [] Seizures   [] History of stroke   [] History of TIA  [] Aphasia   [] Vissual changes   [] Weakness or numbness in arm   [] Weakness or numbness in leg Musculoskeletal:   [] Joint swelling   [x] Joint pain   [x] Low back pain Hematologic:  [] Easy bruising  [] Easy bleeding   [] Hypercoagulable state   [] Anemic  Gastrointestinal:  [] Diarrhea   [] Vomiting  [] Gastroesophageal reflux/heartburn   [] Difficulty swallowing. Genitourinary:  [] Chronic kidney disease   [] Difficult urination  [] Frequent urination   [] Blood in urine Skin:  [] Rashes   [] Ulcers  Psychological:  [] History of anxiety   []  History of major depression.  Physical Examination  Vitals:   10/23/19 1042  BP: 122/61  Pulse: 71  Resp: 16  Weight: 196 lb 9.6 oz (89.2 kg)   Body mass index is 38.4 kg/m. Gen: WD/WN, NAD Head: [place]/AT, No temporalis wasting.  Ear/Nose/Throat: Hearing grossly intact, nares w/o erythema or drainage Eyes: PER, EOMI, sclera nonicteric.  Neck: Supple, no large masses.  Pulmonary:  Good air movement, no audible wheezing bilaterally, no use of accessory muscles.  Cardiac: RRR, no JVD Vascular:  Vessel Right Left  Radial Palpable Palpable  PT Not Palpable Not Palpable  DP Not Palpable Not Palpable  Gastrointestinal: Non-distended. No guarding/no peritoneal signs.  Musculoskeletal: M/S 5/5 throughout.  No deformity or atrophy.  Neurologic: CN 2-12 intact. Symmetrical.  Speech is fluent. Motor exam as listed above. Psychiatric: Judgment intact, Mood & affect appropriate for pt's clinical situation. Dermatologic: No rashes or ulcers noted.  No changes consistent with cellulitis. Lymph : No lichenification or skin changes of chronic lymphedema.  CBC Lab Results  Component Value Date   WBC 8.8 07/30/2016   HGB 11.8 (L) 07/30/2016   HCT 35.1 07/30/2016   MCV 83.5 07/30/2016   PLT 139 (L) 07/30/2016    BMET    Component Value Date/Time   NA 141 07/30/2016 0603   NA 136 06/11/2013 1632   K 3.5 07/30/2016 0603   K 4.0 06/18/2014 1215   CL 109 07/30/2016 0603   CL 103 06/11/2013 1632   CO2 25 07/30/2016 0603   CO2 27 06/11/2013 1632   GLUCOSE 118 (H) 07/30/2016 0603   GLUCOSE 107 (H) 06/11/2013 1632   BUN 25 (H) 09/26/2019 0928   BUN 27 (H) 06/11/2013 1632   CREATININE 0.88 09/26/2019 0928    CREATININE 1.20 06/11/2013 1632   CALCIUM 8.1 (L) 07/30/2016 0603   CALCIUM 9.6 06/11/2013 1632   GFRNONAA >60 09/26/2019 0928   GFRNONAA 47 (L) 06/11/2013 1632   GFRAA >60 09/26/2019 0928   GFRAA 54 (L) 06/11/2013 1632   CrCl cannot be calculated (Patient's most recent lab result is older than the maximum 21 days allowed.).  COAG Lab Results  Component Value Date   INR 0.89 07/28/2016   INR 1.1 02/25/2013   INR 1.0 07/21/2012    Radiology No results found.   Assessment/Plan 1. Atherosclerotic peripheral vascular disease with intermittent claudication (HCC)  Recommend:  The patient has evidence of atherosclerosis of the lower extremities with claudication.  The patient does not voice lifestyle limiting changes at this point in time.  Noninvasive studies do not suggest clinically significant change.  No invasive studies, angiography or surgery at this time The patient should continue walking and begin a more formal exercise program.  The patient should continue antiplatelet therapy and aggressive treatment of the lipid abnormalities  No changes in the patient's medications at this time  The patient should continue wearing graduated compression socks 10-15 mmHg strength to control the mild edema.   - LE ARTERIAL; Future  2. Bilateral carotid artery stenosis Recommend:  Given the patient's asymptomatic subcritical stenosis no further invasive testing or surgery at this time.  Duplex ultrasound shows <40% stenosis bilaterally.  Continue antiplatelet therapy as prescribed Continue management of CAD, HTN and Hyperlipidemia Healthy heart diet,  encouraged exercise at least 4 times per week Follow up in 6 months with duplex ultrasound and physical exam   3. Coronary artery disease of native artery of native heart with stable angina pectoris (HCC) Continue cardiac and antihypertensive medications as already ordered and reviewed, no changes at this time.  Continue statin  as ordered and reviewed, no changes at this time  Nitrates PRN for chest pain   4. Essential hypertension Continue antihypertensive medications as already ordered, these medications have been reviewed and there are no changes at this time.   5. COPD (chronic obstructive pulmonary disease) with chronic bronchitis (HCC) Continue  pulmonary medications and aerosols as already ordered, these medications have been reviewed and there are no changes at this time.    6. Type 2 diabetes mellitus with stage 2 chronic kidney disease, without long-term current use of insulin (HCC) Continue hypoglycemic medications as already ordered, these medications have been reviewed and there are no changes at this time.  Hgb A1C to be monitored as already arranged by primary service     Hortencia Pilar, MD  10/23/2019 10:50 AM

## 2020-01-07 ENCOUNTER — Ambulatory Visit: Payer: Medicare Other | Attending: Internal Medicine

## 2020-01-07 ENCOUNTER — Other Ambulatory Visit: Payer: Self-pay

## 2020-01-07 DIAGNOSIS — Z23 Encounter for immunization: Secondary | ICD-10-CM | POA: Insufficient documentation

## 2020-01-07 NOTE — Progress Notes (Signed)
   Covid-19 Vaccination Clinic  Name:  Alyssa Murillo    MRN: [id number] DOB: January 09, 1946  01/07/2020  [name] was observed post Covid-19 immunization for 15 minutes without incidence. She was provided with Vaccine Information Sheet and instruction to access the V-Safe system.   [name] was instructed to call 911 with any severe reactions post vaccine: Marland Kitchen Difficulty breathing  . Swelling of your face and throat  . A fast heartbeat  . A bad rash all over your body  . Dizziness and weakness    Immunizations Administered    Name Date Dose VIS Date Route   Pfizer COVID-19 Vaccine 01/07/2020 12:14 PM 0.3 mL 10/27/2019 Intramuscular   Manufacturer: [place]   Lot: [id number]   [place]: [id number]

## 2020-01-30 ENCOUNTER — Ambulatory Visit: Payer: Medicare Other | Attending: Internal Medicine

## 2020-01-30 DIAGNOSIS — Z23 Encounter for immunization: Secondary | ICD-10-CM

## 2020-01-30 NOTE — Progress Notes (Signed)
   Covid-19 Vaccination Clinic  Name:  Alyssa Murillo    MRN: [id number] DOB: 11/17/1945  01/30/2020  [name] was observed post Covid-19 immunization for 15 minutes without incident. She was provided with Vaccine Information Sheet and instruction to access the V-Safe system.   [name] was instructed to call 911 with any severe reactions post vaccine: Marland Kitchen Difficulty breathing  . Swelling of face and throat  . A fast heartbeat  . A bad rash all over body  . Dizziness and weakness   Immunizations Administered    Name Date Dose VIS Date Route   Pfizer COVID-19 Vaccine 01/30/2020 11:21 AM 0.3 mL 10/27/2019 Intramuscular   Manufacturer: [place]   Lot: [id number]   [place]: [id number]

## 2020-04-22 ENCOUNTER — Encounter (INDEPENDENT_AMBULATORY_CARE_PROVIDER_SITE_OTHER): Payer: Medicare Other

## 2020-04-22 ENCOUNTER — Ambulatory Visit (INDEPENDENT_AMBULATORY_CARE_PROVIDER_SITE_OTHER): Payer: Medicare Other | Admitting: Vascular Surgery

## 2020-04-30 ENCOUNTER — Ambulatory Visit (INDEPENDENT_AMBULATORY_CARE_PROVIDER_SITE_OTHER): Payer: Medicare Other | Admitting: Nurse Practitioner

## 2020-04-30 ENCOUNTER — Encounter (INDEPENDENT_AMBULATORY_CARE_PROVIDER_SITE_OTHER): Payer: Medicare Other

## 2020-06-28 ENCOUNTER — Other Ambulatory Visit: Payer: Self-pay | Admitting: Internal Medicine

## 2020-06-28 DIAGNOSIS — Z1231 Encounter for screening mammogram for malignant neoplasm of breast: Secondary | ICD-10-CM

## 2020-07-17 ENCOUNTER — Ambulatory Visit
Admission: RE | Admit: 2020-07-17 | Discharge: 2020-07-17 | Disposition: A | Discharge status: Home / Self Care | Payer: Medicare Other | Source: Ambulatory Visit | Attending: Internal Medicine | Admitting: Internal Medicine

## 2020-07-17 ENCOUNTER — Other Ambulatory Visit: Payer: Self-pay

## 2020-07-17 DIAGNOSIS — Z1231 Encounter for screening mammogram for malignant neoplasm of breast: Secondary | ICD-10-CM | POA: Insufficient documentation

## 2020-11-25 ENCOUNTER — Encounter (INDEPENDENT_AMBULATORY_CARE_PROVIDER_SITE_OTHER): Payer: Medicare Other

## 2020-11-25 ENCOUNTER — Ambulatory Visit (INDEPENDENT_AMBULATORY_CARE_PROVIDER_SITE_OTHER): Payer: Medicare Other | Admitting: Nurse Practitioner

## 2020-12-12 ENCOUNTER — Ambulatory Visit (INDEPENDENT_AMBULATORY_CARE_PROVIDER_SITE_OTHER): Payer: Medicare Other

## 2020-12-12 ENCOUNTER — Other Ambulatory Visit: Payer: Self-pay

## 2020-12-12 DIAGNOSIS — I70219 Atherosclerosis of native arteries of extremities with intermittent claudication, unspecified extremity: Secondary | ICD-10-CM

## 2020-12-16 ENCOUNTER — Encounter (INDEPENDENT_AMBULATORY_CARE_PROVIDER_SITE_OTHER): Payer: Self-pay | Admitting: *Deleted

## 2021-07-29 ENCOUNTER — Encounter: Payer: Self-pay | Admitting: Internal Medicine

## 2021-07-30 ENCOUNTER — Ambulatory Visit: Payer: Medicare Other | Admitting: Anesthesiology

## 2021-07-30 ENCOUNTER — Encounter: Payer: Self-pay | Admitting: Internal Medicine

## 2021-07-30 ENCOUNTER — Other Ambulatory Visit: Payer: Self-pay

## 2021-07-30 ENCOUNTER — Encounter
Admission: RE | Disposition: A | Discharge status: Home / Self Care | Payer: Self-pay | Source: Ambulatory Visit | Attending: Internal Medicine

## 2021-07-30 ENCOUNTER — Ambulatory Visit
Admission: RE | Admit: 2021-07-30 | Discharge: 2021-07-30 | Disposition: A | Discharge status: Home / Self Care | Payer: Medicare Other | Source: Ambulatory Visit | Attending: Internal Medicine | Admitting: Internal Medicine

## 2021-07-30 DIAGNOSIS — D122 Benign neoplasm of ascending colon: Secondary | ICD-10-CM | POA: Insufficient documentation

## 2021-07-30 DIAGNOSIS — Z800 Family history of malignant neoplasm of digestive organs: Secondary | ICD-10-CM | POA: Diagnosis not present

## 2021-07-30 DIAGNOSIS — Z7982 Long term (current) use of aspirin: Secondary | ICD-10-CM | POA: Insufficient documentation

## 2021-07-30 DIAGNOSIS — K5730 Diverticulosis of large intestine without perforation or abscess without bleeding: Secondary | ICD-10-CM | POA: Insufficient documentation

## 2021-07-30 DIAGNOSIS — Z8542 Personal history of malignant neoplasm of other parts of uterus: Secondary | ICD-10-CM | POA: Diagnosis not present

## 2021-07-30 DIAGNOSIS — Z1211 Encounter for screening for malignant neoplasm of colon: Secondary | ICD-10-CM | POA: Insufficient documentation

## 2021-07-30 DIAGNOSIS — Z955 Presence of coronary angioplasty implant and graft: Secondary | ICD-10-CM | POA: Diagnosis not present

## 2021-07-30 DIAGNOSIS — D123 Benign neoplasm of transverse colon: Secondary | ICD-10-CM | POA: Diagnosis not present

## 2021-07-30 DIAGNOSIS — Z951 Presence of aortocoronary bypass graft: Secondary | ICD-10-CM | POA: Insufficient documentation

## 2021-07-30 DIAGNOSIS — Z7952 Long term (current) use of systemic steroids: Secondary | ICD-10-CM | POA: Insufficient documentation

## 2021-07-30 DIAGNOSIS — Z8673 Personal history of transient ischemic attack (TIA), and cerebral infarction without residual deficits: Secondary | ICD-10-CM | POA: Insufficient documentation

## 2021-07-30 DIAGNOSIS — Z7951 Long term (current) use of inhaled steroids: Secondary | ICD-10-CM | POA: Diagnosis not present

## 2021-07-30 DIAGNOSIS — K642 Third degree hemorrhoids: Secondary | ICD-10-CM | POA: Diagnosis not present

## 2021-07-30 DIAGNOSIS — Z87891 Personal history of nicotine dependence: Secondary | ICD-10-CM | POA: Insufficient documentation

## 2021-07-30 DIAGNOSIS — Z885 Allergy status to narcotic agent status: Secondary | ICD-10-CM | POA: Diagnosis not present

## 2021-07-30 DIAGNOSIS — Z888 Allergy status to other drugs, medicaments and biological substances status: Secondary | ICD-10-CM | POA: Diagnosis not present

## 2021-07-30 DIAGNOSIS — Z9221 Personal history of antineoplastic chemotherapy: Secondary | ICD-10-CM | POA: Diagnosis not present

## 2021-07-30 DIAGNOSIS — Z79899 Other long term (current) drug therapy: Secondary | ICD-10-CM | POA: Diagnosis not present

## 2021-07-30 DIAGNOSIS — Z91048 Other nonmedicinal substance allergy status: Secondary | ICD-10-CM | POA: Insufficient documentation

## 2021-07-30 HISTORY — DX: Chronic kidney disease, unspecified: N18.9

## 2021-07-30 HISTORY — PX: COLONOSCOPY WITH PROPOFOL: SHX5780

## 2021-07-30 HISTORY — DX: Type 2 diabetes mellitus without complications: E11.9

## 2021-07-30 HISTORY — DX: Cerebral infarction, unspecified: I63.9

## 2021-07-30 HISTORY — DX: Unspecified asthma, uncomplicated: J45.909

## 2021-07-30 LAB — GLUCOSE, CAPILLARY: Glucose-Capillary: 109 mg/dL — ABNORMAL HIGH (ref 70–99)

## 2021-07-30 SURGERY — COLONOSCOPY WITH PROPOFOL
Anesthesia: General

## 2021-07-30 MED ORDER — MIDAZOLAM HCL 2 MG/2ML IJ SOLN
INTRAMUSCULAR | Status: DC | PRN
  Administered 2021-07-30: 1 mg via INTRAVENOUS
  Administered 2021-07-30 (×2): .5 mg via INTRAVENOUS

## 2021-07-30 MED ORDER — MIDAZOLAM HCL 2 MG/2ML IJ SOLN
INTRAMUSCULAR | Status: AC
  Filled 2021-07-30: qty 2

## 2021-07-30 MED ORDER — PHENYLEPHRINE HCL (PRESSORS) 10 MG/ML IV SOLN
INTRAVENOUS | Status: DC | PRN
  Administered 2021-07-30 (×3): 100 ug via INTRAVENOUS

## 2021-07-30 MED ORDER — FENTANYL CITRATE (PF) 100 MCG/2ML IJ SOLN
INTRAMUSCULAR | Status: AC
  Filled 2021-07-30: qty 2

## 2021-07-30 MED ORDER — PROPOFOL 10 MG/ML IV BOLUS
INTRAVENOUS | Status: DC | PRN
  Administered 2021-07-30: 50 mg via INTRAVENOUS

## 2021-07-30 MED ORDER — SODIUM CHLORIDE 0.9 % IV SOLN: INTRAVENOUS | Status: DC

## 2021-07-30 MED ORDER — FENTANYL CITRATE (PF) 100 MCG/2ML IJ SOLN
INTRAMUSCULAR | Status: DC | PRN
  Administered 2021-07-30 (×4): 25 ug via INTRAVENOUS

## 2021-07-30 NOTE — Anesthesia Preprocedure Evaluation (Signed)
Anesthesia Evaluation  Patient identified by MRN, date of birth, ID band Patient awake    Reviewed: Allergy & Precautions, NPO status , Patient's Chart, lab work & pertinent test results  History of Anesthesia Complications Negative for: history of anesthetic complications  Airway Mallampati: III  TM Distance: >3 FB Neck ROM: full    Dental  (+) Chipped   Pulmonary asthma , COPD, former smoker,    Pulmonary exam normal        Cardiovascular hypertension, + CAD and + Peripheral Vascular Disease  Normal cardiovascular exam+ dysrhythmias      Neuro/Psych PSYCHIATRIC DISORDERS  Neuromuscular disease CVA, Residual Symptoms negative psych ROS   GI/Hepatic Neg liver ROS, GERD  Medicated and Controlled,  Endo/Other  diabetes, Type 2  Renal/GU Renal disease  negative genitourinary   Musculoskeletal  (+) Arthritis ,   Abdominal   Peds  Hematology negative hematology ROS (+)   Anesthesia Other Findings Past Medical History: No date: Anemia No date: Asthma 2005: Brachial vein thrombus, right (HCC) No date: CAD (coronary artery disease) 07/2012, 02/2013: Carotid artery plaque     Comment:  removal of carotid artery plaque...bilaterally No date: Chronic kidney disease No date: Colon polyps No date: Depression No date: Diabetes mellitus without complication ([place]) 99991111: Endometrial cancer ([place]) No date: Esophagitis No date: Gastritis No date: GERD (gastroesophageal reflux disease) 2014: H/O colonoscopy 2015: H/O mammogram 01/2008: History of brachytherapy     Comment:  finished 2009 No date: History of chemotherapy     Comment:  carboplatin, paclitaxel x 4 cycles No date: Hyperlipidemia No date: Hypertension No date: Left ventricular hypertrophy No date: Mitral valve regurgitation No date: Osteoporosis     Comment:  tx with evista started No date: PAD (peripheral artery disease) (HCC) No date: PVD (peripheral  vascular disease) ([place]) 2004: Stented coronary artery No date: Stroke [facility]) No date: Tricuspid regurgitation  Past Surgical History: No date: ABDOMINAL HYSTERECTOMY 2014: CAROTID ENDARTERECTOMY; Bilateral 08/23/2015: CARPAL TUNNEL RELEASE; Right     Comment:  Procedure: CARPAL TUNNEL RELEASE;  Surgeon: Dereck Leep, MD;  Location: [facility] ORS;  Service: Orthopedics;                Laterality: Right; No date: CORONARY ARTERY BYPASS GRAFT     Comment:  stent placement in her circumflex coronary artery               laminectomy 2004: [place]; Left     Comment:  At [place] 2004: FEMORAL ARTERY - POPLITEAL ARTERY BYPASS GRAFT; Right     Comment:  right femoral graft 09/26/2019: LOWER EXTREMITY ANGIOGRAPHY; Left     Comment:  Procedure: LOWER EXTREMITY ANGIOGRAPHY;  Surgeon:               Katha Cabal, MD;  Location: [place] [facility];               Service: Cardiovascular;  Laterality: Left; 07/01/2016: PERIPHERAL VASCULAR CATHETERIZATION; Left     Comment:  Procedure: Carotid PTA/Stent Intervention;  Surgeon:               Katha Cabal, MD;  Location: [place] [facility];                Service: Cardiovascular;  Laterality: Left; 07/29/2016: PERIPHERAL VASCULAR CATHETERIZATION; Left     Comment:  Procedure: Carotid PTA/Stent Intervention;  Surgeon:               Katha Cabal, MD;  Location: [place] [facility];                Service: Cardiovascular;  Laterality: Left; 2015: ROTATOR CUFF REPAIR; Right 09/2007: TOTAL ABDOMINAL HYSTERECTOMY W/ BILATERAL SALPINGOOPHORECTOMY     Reproductive/Obstetrics negative OB ROS                             Anesthesia Physical Anesthesia Plan  ASA: 3  Anesthesia Plan: General   Post-op Pain Management:    Induction: Intravenous  PONV Risk Score and Plan: Propofol infusion and TIVA  Airway Management Planned: Natural Airway and Nasal  Cannula  Additional Equipment:   Intra-op Plan:   Post-operative Plan:   Informed Consent: I have reviewed the patients History and Physical, chart, labs and discussed the procedure including the risks, benefits and alternatives for the proposed anesthesia with the patient or authorized representative who has indicated his/her understanding and acceptance.     Dental Advisory Given  Plan Discussed with: Anesthesiologist, CRNA and Surgeon  Anesthesia Plan Comments: (Patient consented for risks of anesthesia including but not limited to:  - adverse reactions to medications - risk of airway placement if required - damage to eyes, teeth, lips or other oral mucosa - nerve damage due to positioning  - sore throat or hoarseness - Damage to heart, brain, nerves, lungs, other parts of body or loss of life  Patient voiced understanding.)        Anesthesia Quick Evaluation

## 2021-07-30 NOTE — Anesthesia Postprocedure Evaluation (Signed)
Anesthesia Post Note  Patient: Alyssa Murillo  Procedure(s) Performed: COLONOSCOPY WITH PROPOFOL  Patient location during evaluation: PACU Anesthesia Type: General Level of consciousness: awake and alert Pain management: pain level controlled Vital Signs Assessment: post-procedure vital signs reviewed and stable Respiratory status: spontaneous breathing, nonlabored ventilation, respiratory function stable and patient connected to nasal cannula oxygen Cardiovascular status: blood pressure returned to baseline and stable Postop Assessment: no apparent nausea or vomiting Anesthetic complications: no   No notable events documented.   Last Vitals:  Vitals:   07/30/21 1214 07/30/21 1335  BP:    Pulse:    Resp:    Temp:  36.8 C  SpO2: 100%     Last Pain:  Vitals:   07/30/21 1345  TempSrc:   PainSc: 0-No pain                 Bette Brienza Doyne Keel

## 2021-07-30 NOTE — Op Note (Signed)
[facility] Gastroenterology Patient Name: Kirti Moynahan Procedure Date: 07/30/2021 12:53 PM MRN: [id number] Account #: [id number] Date of Birth: 1945-12-08 Admit Type: Outpatient Age: 75 Room: [facility] ENDO ROOM 2 Gender: Female Note Status: Finalized Instrument Name: Park Meo [id number] Procedure:             Colonoscopy Indications:           Screening in patient at increased risk: Family history                         of 1st-degree relative with colorectal cancer before                         age 62 years Providers:             Benay Pike. Alice Reichert MD, MD Referring MD:          Ocie Cornfield. Ouida Sills MD, MD (Referring MD) Medicines:             Propofol per Anesthesia Complications:         No immediate complications. Procedure:             Pre-Anesthesia Assessment:                        - The risks and benefits of the procedure and the                         sedation options and risks were discussed with the                         patient. All questions were answered and informed                         consent was obtained.                        - Patient identification and proposed procedure were                         verified prior to the procedure by the nurse. The                         procedure was verified in the procedure room.                        - ASA Grade Assessment: III - A patient with severe                         systemic disease.                        - After reviewing the risks and benefits, the patient                         was deemed in satisfactory condition to undergo the                         procedure.  After obtaining informed consent, the colonoscope was                         passed under direct vision. Throughout the procedure,                         the patient's blood pressure, pulse, and oxygen                         saturations were monitored continuously. The                          Colonoscope was introduced through the anus and                         advanced to the the cecum, identified by appendiceal                         orifice and ileocecal valve. The colonoscopy was                         somewhat difficult due to multiple diverticula in the                         colon. Successful completion of the procedure was                         aided by withdrawing and reinserting the scope. The                         patient tolerated the procedure well. The quality of                         the bowel preparation was good. The ileocecal valve,                         appendiceal orifice, and rectum were photographed. Findings:      The perianal exam findings include internal hemorrhoids that prolapse       with straining, but require manual replacement into the anal canal       (Grade III).      Non-bleeding internal hemorrhoids were found during retroflexion. The       hemorrhoids were Grade I (internal hemorrhoids that do not prolapse).      Multiple small and large-mouthed diverticula were found in the sigmoid       colon. There was no evidence of diverticular bleeding.      A 5 mm polyp was found in the splenic flexure. The polyp was sessile.       The polyp was removed with a jumbo cold forceps. Resection and retrieval       were complete.      Two sessile polyps were found in the ascending colon. The polyps were 5       to 8 mm in size. These polyps were removed with a hot snare. Resection       and retrieval were complete. To prevent bleeding after the polypectomy,       one hemostatic clip was successfully placed (MR conditional). There was  no bleeding during, or at the end, of the procedure.      The exam was otherwise without abnormality. Impression:            - Internal hemorrhoids that prolapse with straining,                         but require manual replacement into the anal canal                         (Grade III) found on perianal  exam.                        - Non-bleeding internal hemorrhoids.                        - Severe diverticulosis in the sigmoid colon. There                         was no evidence of diverticular bleeding.                        - One 5 mm polyp at the splenic flexure, removed with                         a jumbo cold forceps. Resected and retrieved.                        - Two 5 to 8 mm polyps in the ascending colon, removed                         with a hot snare. Resected and retrieved. Clip (MR                         conditional) was placed.                        - The examination was otherwise normal. Recommendation:        - Patient has a contact number available for                         emergencies. The signs and symptoms of potential                         delayed complications were discussed with the patient.                         Return to normal activities tomorrow. Written                         discharge instructions were provided to the patient.                        - Resume previous diet.                        - Continue present medications.                        - Await pathology results.                        -  If polyps are benign or adenomatous without                         dysplasia, I will advise NO further colonoscopy due to                         advanced age and/or severe comorbidity.                        - Return to GI office PRN.                        - High fiber diet.                        - The findings and recommendations were discussed with                         the patient. Procedure Code(s):     --- Professional ---                        [phone], Colonoscopy, flexible; with removal of                         tumor(s), polyp(s), or other lesion(s) by snare                         technique                        45380, 48, Colonoscopy, flexible; with biopsy, single                         or multiple Diagnosis Code(s):     ---  Professional ---                        K57.30, Diverticulosis of large intestine without                         perforation or abscess without bleeding                        K64.2, Third degree hemorrhoids                        Z80.0, Family history of malignant neoplasm of                         digestive organs                        K63.5, Polyp of colon CPT copyright 2019 American Medical Association. All rights reserved. The codes documented in this report are preliminary and upon coder review may  be revised to meet current compliance requirements. Efrain Sella MD, MD 07/30/2021 1:32:55 PM This report has been signed electronically. Number of Addenda: 0 Note Initiated On: 07/30/2021 12:53 PM Scope Withdrawal Time: 0 hours 7 minutes 46 seconds  Total Procedure Duration: 0 hours 16 minutes 49 seconds  Estimated Blood Loss:  Estimated blood loss: none. Estimated blood loss: none.  [facility]

## 2021-07-30 NOTE — Interval H&P Note (Signed)
History and Physical Interval Note:  07/30/2021 12:48 PM  Alyssa Murillo  has presented today for surgery, with the diagnosis of FM HX CCA.  The various methods of treatment have been discussed with the patient and family. After consideration of risks, benefits and other options for treatment, the patient has consented to  Procedure(s): COLONOSCOPY WITH PROPOFOL (N/A) as a surgical intervention.  The patient's history has been reviewed, patient examined, no change in status, stable for surgery.  I have reviewed the patient's chart and labs.  Questions were answered to the patient's satisfaction.     [place], [place]

## 2021-07-30 NOTE — Transfer of Care (Signed)
Immediate Anesthesia Transfer of Care Note  Patient: Alyssa Murillo  Procedure(s) Performed: COLONOSCOPY WITH PROPOFOL  Patient Location: PACU  Anesthesia Type:General  Level of Consciousness: awake, alert  and oriented  Airway & Oxygen Therapy: Patient Spontanous Breathing and Patient connected to nasal cannula oxygen  Post-op Assessment: Report given to RN, Post -op Vital signs reviewed and stable and Patient moving all extremities  Post vital signs: Reviewed and stable  Last Vitals:  Vitals Value Taken Time  BP 105/49 07/30/21 1332  Temp    Pulse 84 07/30/21 1333  Resp 22 07/30/21 1333  SpO2 98 % 07/30/21 1333  Vitals shown include unvalidated device data.  Last Pain:  Vitals:   07/30/21 1209  TempSrc: Temporal  PainSc: 0-No pain         Complications: No notable events documented.

## 2021-07-30 NOTE — H&P (Signed)
Outpatient short stay form Pre-procedure 07/30/2021 12:47 PM Alyssa Murillo K. Alice Reichert, M.D.  Primary Physician: Frazier Richards, M.D.  Reason for visit:  Family history of colon cancer (Brother).  History of present illness:   75 year old patient presenting for family history of colon cancer. Patient denies any change in bowel habits, rectal bleeding or involuntary weight loss.     Current Facility-Administered Medications:    0.9 %  sodium chloride infusion, , Intravenous, Continuous, [place], Benay Pike, MD, Last Rate: 20 mL/hr at 07/30/21 1228, New Bag at 07/30/21 1228  Medications Prior to Admission  Medication Sig Dispense Refill Last Dose   albuterol (VENTOLIN HFA) 108 (90 Base) MCG/ACT inhaler Inhale 2 puffs into the lungs every 6 (six) hours as needed for wheezing.    07/30/2021   aspirin 325 MG tablet Take 325 mg by mouth daily.    07/29/2021   esomeprazole (NEXIUM) 20 MG packet Take 20 mg by mouth daily before supper. Take 30 minutes before supper   07/29/2021   fluticasone-salmeterol (ADVAIR) 250-50 MCG/ACT AEPB Inhale 1 puff into the lungs in the morning and at bedtime.   07/29/2021   isosorbide mononitrate (IMDUR) 30 MG 24 hr tablet Take 1 tablet by mouth daily. In am   07/30/2021   montelukast (SINGULAIR) 10 MG tablet Take 10 mg by mouth at bedtime.   07/29/2021   predniSONE (DELTASONE) 20 MG tablet Take 20 mg by mouth daily.      propranolol (INDERAL) 80 MG tablet Take 1 tablet by mouth daily. In am   07/29/2021   rosuvastatin (CRESTOR) 20 MG tablet Take 1 tablet by mouth daily. In am   07/29/2021   spironolactone (ALDACTONE) 25 MG tablet Take 0.5 tablets by mouth daily. In am   07/29/2021   tiotropium (SPIRIVA) 18 MCG inhalation capsule Place 18 mcg into inhaler and inhale daily. In am   07/29/2021   torsemide (DEMADEX) 20 MG tablet Take 20 mg by mouth daily. In the morning   07/29/2021   colchicine-probenecid 0.5-500 MG per tablet Take 1 tablet by mouth 2 (two) times daily as needed  (gout).       esomeprazole (NEXIUM) 40 MG capsule Take 40 mg by mouth daily.       nitroGLYCERIN (NITROSTAT) 0.4 MG SL tablet Place 0.4 mg under the tongue every 5 (five) minutes as needed for chest pain.      PREMARIN vaginal cream PLACE 1 APPLICATORFUL VAGINALLY 3 (THREE) TIMES A WEEK. (Patient taking differently: Place 1 Applicatorful vaginally 3 (three) times a week. ) 30 g 2    traZODone (DESYREL) 100 MG tablet Take 1-2 tablets by mouth at bedtime as needed for sleep.         Allergies  Allergen Reactions   Bisphosphonates Shortness Of Breath   Codeine Itching   Clopidogrel Bisulfate Other (See Comments)    Decreased blood count   Tape Other (See Comments)    Takes my skin off. It is ok to use paper tape. Takes my skin off. It is ok to use paper tape.   Lisinopril Cough     Past Medical History:  Diagnosis Date   Anemia    Asthma    Brachial vein thrombus, right ([place]) 2005   CAD (coronary artery disease)    Carotid artery plaque 07/2012, 02/2013   removal of carotid artery plaque...bilaterally   Chronic kidney disease    Colon polyps    Depression    Diabetes mellitus without complication ([place])  Endometrial cancer ([place]) 09/2007   Esophagitis    Gastritis    GERD (gastroesophageal reflux disease)    H/O colonoscopy 2014   H/O mammogram 2015   History of brachytherapy 01/2008   finished 2009   History of chemotherapy    carboplatin, paclitaxel x 4 cycles   Hyperlipidemia    Hypertension    Left ventricular hypertrophy    Mitral valve regurgitation    Osteoporosis    tx with evista started   PAD (peripheral artery disease) ([place])    PVD (peripheral vascular disease) ([place])    Stented coronary artery 2004   Stroke [facility])    Tricuspid regurgitation     Review of systems:  Otherwise negative.    Physical Exam  Gen: Alert, oriented. Appears stated age.  HEENT: [place]/AT. PERRLA. Lungs: CTA, no wheezes. CV: RR nl S1, S2. Abd: soft, benign, no masses. BS+ Ext:  No edema. Pulses 2+    Planned procedures: Proceed with colonoscopy. The patient understands the nature of the planned procedure, indications, risks, alternatives and potential complications including but not limited to bleeding, infection, perforation, damage to internal organs and possible oversedation/side effects from anesthesia. The patient agrees and gives consent to proceed.  Please refer to procedure notes for findings, recommendations and patient disposition/instructions.     Alyssa Murillo K. Alice Reichert, M.D. Gastroenterology 07/30/2021  12:47 PM

## 2021-07-31 ENCOUNTER — Encounter: Payer: Self-pay | Admitting: Internal Medicine

## 2021-08-01 LAB — SURGICAL PATHOLOGY

## 2021-09-15 ENCOUNTER — Other Ambulatory Visit: Payer: Self-pay | Admitting: Internal Medicine

## 2021-09-15 DIAGNOSIS — Z1231 Encounter for screening mammogram for malignant neoplasm of breast: Secondary | ICD-10-CM

## 2021-12-10 NOTE — Progress Notes (Deleted)
MRN : [id number]  Alyssa Murillo is a 75 y.o. (08/16/1946) female who presents with chief complaint of check circulation.  History of Present Illness:   The patient returns to the office for followup and review status post angiogram. The patient notes improvement in the lower extremity symptoms. No interval shortening of the patient's claudication distance or rest pain symptoms. Previous wounds have now healed.  No new ulcers or wounds have occurred since the last visit.   There have been no significant changes to the patient's overall health care.   The patient denies amaurosis fugax or recent TIA symptoms. There are no recent neurological changes noted. The patient denies history of DVT, PE or superficial thrombophlebitis. The patient denies recent episodes of angina or shortness of breath.   No outpatient medications have been marked as taking for the 12/15/21 encounter (Appointment) with Delana Meyer, Dolores Lory, MD.    Past Medical History:  Diagnosis Date   Anemia    Asthma    Brachial vein thrombus, right ([place]) 2005   CAD (coronary artery disease)    Carotid artery plaque 07/2012, 02/2013   removal of carotid artery plaque...bilaterally   Chronic kidney disease    Colon polyps    Depression    Diabetes mellitus without complication ([place])    Endometrial cancer ([place]) 09/2007   Esophagitis    Gastritis    GERD (gastroesophageal reflux disease)    H/O colonoscopy 2014   H/O mammogram 2015   History of brachytherapy 01/2008   finished 2009   History of chemotherapy    carboplatin, paclitaxel x 4 cycles   Hyperlipidemia    Hypertension    Left ventricular hypertrophy    Mitral valve regurgitation    Osteoporosis    tx with evista started   PAD (peripheral artery disease) ([place])    PVD (peripheral vascular disease) ([place])    Stented coronary artery 2004   Stroke [facility])    Tricuspid regurgitation     Past Surgical History:  Procedure Laterality Date   ABDOMINAL  HYSTERECTOMY     CAROTID ENDARTERECTOMY Bilateral 2014   CARPAL TUNNEL RELEASE Right 08/23/2015   Procedure: CARPAL TUNNEL RELEASE;  Surgeon: Dereck Leep, MD;  Location: [facility] ORS;  Service: Orthopedics;  Laterality: Right;   COLONOSCOPY WITH PROPOFOL N/A 07/30/2021   Procedure: COLONOSCOPY WITH PROPOFOL;  Surgeon: [place], Benay Pike, MD;  Location: [facility] ENDOSCOPY;  Service: Gastroenterology;  Laterality: N/A;   CORONARY ARTERY BYPASS GRAFT     stent placement in her circumflex coronary artery laminectomy   FEMORAL ARTERY - FEMORAL ARTERY BYPASS GRAFT Left 2004   At [place] GRAFT Right 2004   right femoral graft   LOWER EXTREMITY ANGIOGRAPHY Left 09/26/2019   Procedure: LOWER EXTREMITY ANGIOGRAPHY;  Surgeon: Katha Cabal, MD;  Location: [place] [facility];  Service: Cardiovascular;  Laterality: Left;   PERIPHERAL VASCULAR CATHETERIZATION Left 07/01/2016   Procedure: Carotid PTA/Stent Intervention;  Surgeon: Katha Cabal, MD;  Location: [place] [facility];  Service: Cardiovascular;  Laterality: Left;   PERIPHERAL VASCULAR CATHETERIZATION Left 07/29/2016   Procedure: Carotid PTA/Stent Intervention;  Surgeon: Katha Cabal, MD;  Location: [place] [facility];  Service: Cardiovascular;  Laterality: Left;   ROTATOR CUFF REPAIR Right 2015   TOTAL ABDOMINAL HYSTERECTOMY W/ BILATERAL SALPINGOOPHORECTOMY  09/2007    Social History Social History   Tobacco Use   Smoking status: Former    Packs/day: 1.00  Years: 25.00    Pack years: 25.00    Types: Cigarettes    Quit date: 04/30/2006    Years since quitting: 15.6   Smokeless tobacco: Never   Tobacco comments:    quit 2007  Vaping Use   Vaping Use: Never used  Substance Use Topics   Alcohol use: Yes    Alcohol/week: 2.0 standard drinks    Types: 1 Glasses of wine, 1 Cans of beer per week    Comment: socially   Drug use: No    Family History Family History  Problem Relation  Age of Onset   Breast cancer Other        niece   Breast cancer Daughter 45    Allergies  Allergen Reactions   Bisphosphonates Shortness Of Breath   Codeine Itching   Clopidogrel Bisulfate Other (See Comments)    Decreased blood count   Tape Other (See Comments)    Takes my skin off. It is ok to use paper tape. Takes my skin off. It is ok to use paper tape.   Lisinopril Cough     REVIEW OF SYSTEMS (Negative unless checked)  Constitutional: [] Weight loss  [] Fever  [] Chills Cardiac: [] Chest pain   [] Chest pressure   [] Palpitations   [] Shortness of breath when laying flat   [] Shortness of breath with exertion. Vascular:  [] Pain in legs with walking   [] Pain in legs at rest  [] History of DVT   [] Phlebitis   [] Swelling in legs   [] Varicose veins   [] Non-healing ulcers Pulmonary:   [] Uses home oxygen   [] Productive cough   [] Hemoptysis   [] Wheeze  [] COPD   [] Asthma Neurologic:  [] Dizziness   [] Seizures   [] History of stroke   [] History of TIA  [] Aphasia   [] Vissual changes   [] Weakness or numbness in arm   [] Weakness or numbness in leg Musculoskeletal:   [] Joint swelling   [] Joint pain   [] Low back pain Hematologic:  [] Easy bruising  [] Easy bleeding   [] Hypercoagulable state   [] Anemic Gastrointestinal:  [] Diarrhea   [] Vomiting  [] Gastroesophageal reflux/heartburn   [] Difficulty swallowing. Genitourinary:  [] Chronic kidney disease   [] Difficult urination  [] Frequent urination   [] Blood in urine Skin:  [] Rashes   [] Ulcers  Psychological:  [] History of anxiety   []  History of major depression.  Physical Examination  There were no vitals filed for this visit. There is no height or weight on file to calculate BMI. Gen: WD/WN, NAD Head: /AT, No temporalis wasting.  Ear/Nose/Throat: Hearing grossly intact, nares w/o erythema or drainage Eyes: PER, EOMI, sclera nonicteric.  Neck: Supple, no masses.  No bruit or JVD.  Pulmonary:  Good air movement, no audible wheezing, no use of  accessory muscles.  Cardiac: RRR, normal S1, S2, no Murmurs. Vascular:  *** Vessel Right Left  Radial Palpable Palpable  Carotid Palpable Palpable  PT Palpable Palpable  DP Palpable Palpable  Gastrointestinal: soft, non-distended. No guarding/no peritoneal signs.  Musculoskeletal: M/S 5/5 throughout.  No visible deformity.  Neurologic: CN 2-12 intact. Pain and light touch intact in extremities.  Symmetrical.  Speech is fluent. Motor exam as listed above. Psychiatric: Judgment intact, Mood & affect appropriate for pt's clinical situation. Dermatologic: No rashes or ulcers noted.  No changes consistent with cellulitis.   CBC Lab Results  Component Value Date   WBC 8.8 07/30/2016   HGB 11.8 (L) 07/30/2016   HCT 35.1 07/30/2016   MCV 83.5 07/30/2016   PLT 139 (L) 07/30/2016  BMET    Component Value Date/Time   NA 141 07/30/2016 0603   NA 136 06/11/2013 1632   K 3.5 07/30/2016 0603   K 4.0 06/18/2014 1215   CL 109 07/30/2016 0603   CL 103 06/11/2013 1632   CO2 25 07/30/2016 0603   CO2 27 06/11/2013 1632   GLUCOSE 118 (H) 07/30/2016 0603   GLUCOSE 107 (H) 06/11/2013 1632   BUN 25 (H) 09/26/2019 0928   BUN 27 (H) 06/11/2013 1632   CREATININE 0.88 09/26/2019 0928   CREATININE 1.20 06/11/2013 1632   CALCIUM 8.1 (L) 07/30/2016 0603   CALCIUM 9.6 06/11/2013 1632   GFRNONAA >60 09/26/2019 0928   GFRNONAA 47 (L) 06/11/2013 1632   GFRAA >60 09/26/2019 0928   GFRAA 54 (L) 06/11/2013 1632   CrCl cannot be calculated (Patient's most recent lab result is older than the maximum 21 days allowed.).  COAG Lab Results  Component Value Date   INR 0.89 07/28/2016   INR 1.1 02/25/2013   INR 1.0 07/21/2012    Radiology No results found.   Assessment/Plan There are no diagnoses linked to this encounter.   Hortencia Pilar, MD  12/10/2021 4:14 PM

## 2021-12-15 ENCOUNTER — Encounter (INDEPENDENT_AMBULATORY_CARE_PROVIDER_SITE_OTHER): Payer: Medicare Other

## 2021-12-15 ENCOUNTER — Ambulatory Visit (INDEPENDENT_AMBULATORY_CARE_PROVIDER_SITE_OTHER): Payer: Medicare Other | Admitting: Vascular Surgery

## 2021-12-15 ENCOUNTER — Other Ambulatory Visit (INDEPENDENT_AMBULATORY_CARE_PROVIDER_SITE_OTHER): Payer: Self-pay | Admitting: Vascular Surgery

## 2021-12-15 DIAGNOSIS — Z9889 Other specified postprocedural states: Secondary | ICD-10-CM

## 2022-09-14 ENCOUNTER — Encounter (INDEPENDENT_AMBULATORY_CARE_PROVIDER_SITE_OTHER): Payer: Self-pay
